# Patient Record
Sex: Female | Born: 1960 | Race: White | Hispanic: No | Marital: Married | Smoking: Former smoker
Health system: Southern US, Community
[De-identification: ages and names within clinical notes are randomized; demographics above are authoritative.]

## PROBLEM LIST (undated history)

## (undated) DIAGNOSIS — F419 Anxiety disorder, unspecified: Secondary | ICD-10-CM

## (undated) DIAGNOSIS — Z87442 Personal history of urinary calculi: Secondary | ICD-10-CM

## (undated) DIAGNOSIS — N83202 Unspecified ovarian cyst, left side: Secondary | ICD-10-CM

## (undated) DIAGNOSIS — F32A Depression, unspecified: Secondary | ICD-10-CM

## (undated) DIAGNOSIS — R51 Headache: Secondary | ICD-10-CM

## (undated) DIAGNOSIS — R519 Headache, unspecified: Secondary | ICD-10-CM

## (undated) DIAGNOSIS — I10 Essential (primary) hypertension: Secondary | ICD-10-CM

## (undated) DIAGNOSIS — F410 Panic disorder [episodic paroxysmal anxiety] without agoraphobia: Secondary | ICD-10-CM

## (undated) DIAGNOSIS — N83201 Unspecified ovarian cyst, right side: Secondary | ICD-10-CM

## (undated) DIAGNOSIS — F329 Major depressive disorder, single episode, unspecified: Secondary | ICD-10-CM

## (undated) HISTORY — DX: Major depressive disorder, single episode, unspecified: F32.9

## (undated) HISTORY — PX: TUBAL LIGATION: SHX77

## (undated) HISTORY — DX: Panic disorder (episodic paroxysmal anxiety): F41.0

## (undated) HISTORY — DX: Depression, unspecified: F32.A

## (undated) HISTORY — DX: Anxiety disorder, unspecified: F41.9

---

## 2004-07-09 ENCOUNTER — Encounter: Admission: RE | Admit: 2004-07-09 | Discharge: 2004-07-09 | Payer: Self-pay | Admitting: Family Medicine

## 2011-02-04 ENCOUNTER — Emergency Department (HOSPITAL_COMMUNITY)
Admission: EM | Admit: 2011-02-04 | Discharge: 2011-02-04 | Disposition: A | Discharge status: Home / Self Care | Payer: Self-pay | Attending: Emergency Medicine | Admitting: Emergency Medicine

## 2011-02-04 ENCOUNTER — Emergency Department (HOSPITAL_COMMUNITY): Payer: Self-pay

## 2011-02-04 DIAGNOSIS — K279 Peptic ulcer, site unspecified, unspecified as acute or chronic, without hemorrhage or perforation: Secondary | ICD-10-CM | POA: Insufficient documentation

## 2011-02-04 DIAGNOSIS — R112 Nausea with vomiting, unspecified: Secondary | ICD-10-CM | POA: Insufficient documentation

## 2011-02-04 DIAGNOSIS — Z87442 Personal history of urinary calculi: Secondary | ICD-10-CM | POA: Insufficient documentation

## 2011-02-04 DIAGNOSIS — R1013 Epigastric pain: Secondary | ICD-10-CM | POA: Insufficient documentation

## 2011-02-04 LAB — COMPREHENSIVE METABOLIC PANEL
ALT: 95 U/L — ABNORMAL HIGH (ref 0–35)
AST: 68 U/L — ABNORMAL HIGH (ref 0–37)
Albumin: 3.8 g/dL (ref 3.5–5.2)
Alkaline Phosphatase: 112 U/L (ref 39–117)
BUN: 7 mg/dL (ref 6–23)
CO2: 23 mEq/L (ref 19–32)
Calcium: 9.3 mg/dL (ref 8.4–10.5)
Chloride: 101 mEq/L (ref 96–112)
Creatinine, Ser: 0.67 mg/dL (ref 0.4–1.2)
GFR calc Af Amer: >60 mL/min (ref >60)
GFR calc non Af Amer: >60 mL/min (ref >60)
Glucose, Bld: 106 mg/dL — ABNORMAL HIGH (ref 70–99)
Potassium: 3.7 mEq/L (ref 3.5–5.1)
Sodium: 137 mEq/L (ref 135–145)
Total Bilirubin: 0.2 mg/dL — ABNORMAL LOW (ref 0.3–1.2)
Total Protein: 7.7 g/dL (ref 6.0–8.3)

## 2011-02-04 LAB — CBC
HCT: 42.4 % (ref 36.0–46.0)
Hemoglobin: 14.2 g/dL (ref 12.0–15.0)
MCH: 27.8 pg (ref 26.0–34.0)
MCHC: 33.5 g/dL (ref 30.0–36.0)
MCV: 83 fL (ref 78.0–100.0)
Platelets: 293 10*3/uL (ref 150–400)
RBC: 5.11 MIL/uL (ref 3.87–5.11)
RDW: 14.1 % (ref 11.5–15.5)
WBC: 9.2 10*3/uL (ref 4.0–10.5)

## 2011-02-04 LAB — URINE MICROSCOPIC-ADD ON

## 2011-02-04 LAB — URINALYSIS, ROUTINE W REFLEX MICROSCOPIC
Bilirubin Urine: NEGATIVE
Glucose, UA: NEGATIVE mg/dL
Ketones, ur: NEGATIVE mg/dL
Nitrite: NEGATIVE
Protein, ur: NEGATIVE mg/dL
Specific Gravity, Urine: 1.006 (ref 1.005–1.030)
Urobilinogen, UA: 0.2 mg/dL (ref 0.0–1.0)
pH: 6 (ref 5.0–8.0)

## 2011-02-04 LAB — DIFFERENTIAL
Basophils Absolute: 0 10*3/uL (ref 0.0–0.1)
Basophils Relative: 0 % (ref 0–1)
Eosinophils Absolute: 0.1 10*3/uL (ref 0.0–0.7)
Eosinophils Relative: 1 % (ref 0–5)
Lymphocytes Relative: 32 % (ref 12–46)
Lymphs Abs: 3 10*3/uL (ref 0.7–4.0)
Monocytes Absolute: 0.8 10*3/uL (ref 0.1–1.0)
Monocytes Relative: 8 % (ref 3–12)
Neutro Abs: 5.4 10*3/uL (ref 1.7–7.7)
Neutrophils Relative %: 58 % (ref 43–77)

## 2011-02-04 LAB — LIPASE, BLOOD: Lipase: 32 U/L (ref 11–59)

## 2011-02-05 LAB — URINE CULTURE
Colony Count: 15000
Culture  Setup Time: 201205242135

## 2018-07-11 ENCOUNTER — Emergency Department (HOSPITAL_COMMUNITY)
Admission: EM | Admit: 2018-07-11 | Discharge: 2018-07-11 | Disposition: A | Discharge status: Home / Self Care | Payer: Self-pay | Attending: Emergency Medicine | Admitting: Emergency Medicine

## 2018-07-11 ENCOUNTER — Other Ambulatory Visit: Payer: Self-pay

## 2018-07-11 ENCOUNTER — Emergency Department (HOSPITAL_COMMUNITY): Payer: Self-pay

## 2018-07-11 ENCOUNTER — Encounter (HOSPITAL_COMMUNITY): Payer: Self-pay | Admitting: Emergency Medicine

## 2018-07-11 DIAGNOSIS — N838 Other noninflammatory disorders of ovary, fallopian tube and broad ligament: Secondary | ICD-10-CM

## 2018-07-11 DIAGNOSIS — I10 Essential (primary) hypertension: Secondary | ICD-10-CM | POA: Insufficient documentation

## 2018-07-11 DIAGNOSIS — N839 Noninflammatory disorder of ovary, fallopian tube and broad ligament, unspecified: Secondary | ICD-10-CM | POA: Insufficient documentation

## 2018-07-11 DIAGNOSIS — N200 Calculus of kidney: Secondary | ICD-10-CM | POA: Insufficient documentation

## 2018-07-11 HISTORY — DX: Essential (primary) hypertension: I10

## 2018-07-11 LAB — COMPREHENSIVE METABOLIC PANEL
ALT: 33 U/L (ref 0–44)
AST: 24 U/L (ref 15–41)
Albumin: 3.8 g/dL (ref 3.5–5.0)
Alkaline Phosphatase: 81 U/L (ref 38–126)
Anion gap: 9 (ref 5–15)
BUN: 14 mg/dL (ref 6–20)
CO2: 21 mmol/L — ABNORMAL LOW (ref 22–32)
Calcium: 9.5 mg/dL (ref 8.9–10.3)
Chloride: 99 mmol/L (ref 98–111)
Creatinine, Ser: 1.06 mg/dL — ABNORMAL HIGH (ref 0.44–1.00)
GFR calc Af Amer: >60 mL/min (ref >60)
GFR calc non Af Amer: 58 mL/min — ABNORMAL LOW (ref >60)
Glucose, Bld: 119 mg/dL — ABNORMAL HIGH (ref 70–99)
Potassium: 3.7 mmol/L (ref 3.5–5.1)
Sodium: 129 mmol/L — ABNORMAL LOW (ref 135–145)
Total Bilirubin: 0.8 mg/dL (ref 0.3–1.2)
Total Protein: 7.4 g/dL (ref 6.5–8.1)

## 2018-07-11 LAB — URINALYSIS, ROUTINE W REFLEX MICROSCOPIC
Bacteria, UA: NONE SEEN
Bilirubin Urine: NEGATIVE
Glucose, UA: NEGATIVE mg/dL
Ketones, ur: NEGATIVE mg/dL
Nitrite: NEGATIVE
Protein, ur: NEGATIVE mg/dL
Specific Gravity, Urine: 1.021 (ref 1.005–1.030)
pH: 6 (ref 5.0–8.0)

## 2018-07-11 LAB — CBC
HCT: 50.4 % — ABNORMAL HIGH (ref 36.0–46.0)
Hemoglobin: 16.8 g/dL — ABNORMAL HIGH (ref 12.0–15.0)
MCH: 27.3 pg (ref 26.0–34.0)
MCHC: 33.3 g/dL (ref 30.0–36.0)
MCV: 82 fL (ref 80.0–100.0)
Platelets: 262 10*3/uL (ref 150–400)
RBC: 6.15 MIL/uL — ABNORMAL HIGH (ref 3.87–5.11)
RDW: 13.2 % (ref 11.5–15.5)
WBC: 14.3 10*3/uL — ABNORMAL HIGH (ref 4.0–10.5)
nRBC: 0 % (ref 0.0–0.2)

## 2018-07-11 LAB — LIPASE, BLOOD: Lipase: 30 U/L (ref 11–51)

## 2018-07-11 LAB — I-STAT BETA HCG BLOOD, ED (MC, WL, AP ONLY): I-stat hCG, quantitative: <5 m[IU]/mL (ref <5)

## 2018-07-11 MED ORDER — ONDANSETRON HCL 4 MG/2ML IJ SOLN
4.0000 mg | Freq: Once | INTRAMUSCULAR | Status: AC
  Administered 2018-07-11: 4 mg via INTRAVENOUS
  Filled 2018-07-11: qty 2

## 2018-07-11 MED ORDER — KETOROLAC TROMETHAMINE 10 MG PO TABS: 10.0000 mg | ORAL_TABLET | Freq: Four times a day (QID) | ORAL | 0 refills | Status: DC | PRN

## 2018-07-11 MED ORDER — KETOROLAC TROMETHAMINE 30 MG/ML IJ SOLN
30.0000 mg | Freq: Once | INTRAMUSCULAR | Status: AC
  Administered 2018-07-11: 30 mg via INTRAVENOUS
  Filled 2018-07-11: qty 1

## 2018-07-11 MED ORDER — KETOROLAC TROMETHAMINE 30 MG/ML IJ SOLN: 30.0000 mg | Freq: Once | INTRAMUSCULAR | Status: DC

## 2018-07-11 MED ORDER — TAMSULOSIN HCL 0.4 MG PO CAPS: 0.4000 mg | ORAL_CAPSULE | Freq: Every day | ORAL | 0 refills | Status: DC

## 2018-07-11 MED ORDER — OXYCODONE-ACETAMINOPHEN 5-325 MG PO TABS: 1.0000 | ORAL_TABLET | Freq: Four times a day (QID) | ORAL | 0 refills | Status: DC | PRN

## 2018-07-11 NOTE — ED Notes (Signed)
Pt provided urine sample but not enough to culture. UA sent to lab.

## 2018-07-11 NOTE — ED Notes (Signed)
Pt provided adequate urine sample.

## 2018-07-11 NOTE — ED Notes (Signed)
Results reviewed, no changes in acuity at this time 

## 2018-07-11 NOTE — ED Provider Notes (Signed)
[facility] EM[facility] DEPARTMENT Provider Note   CSN: [id number] Arrival date & time: 07/11/18  1141     History   Chief Complaint Chief Complaint  Patient presents with  . Flank Pain    HPI Melanie Gallegos is a 57 y.o. female.  HPI   57 year old female presents today with complaints of left-sided flank pain.  Patient notes symptoms started last night in her epigastric region, is radiated over to the left upper quadrant and flank.  Patient notes symptoms feel similar to previous kidney stone.  He notes some nausea and vomiting, denies any lower abdominal pain, notes decreased urine output secondary to decreased p.o. Intake.  She denies any dysuria.  She notes taking hydrocodone this morning at 8:00 and 10 AM with very minimal improvement in her symptoms.  Denies any fever.   Past Medical History:  Diagnosis Date  . Hypertension     There are no active problems to display for this patient.    OB History   None      Home Medications    Prior to Admission medications   Medication Sig Start Date End Date Taking? Authorizing Provider  oxyCODONE-acetaminophen (PERCOCET/ROXICET) 5-325 MG tablet Take 1 tablet by mouth every 6 (six) hours as needed for severe pain. 07/11/18   Shyne Lehrke, Dellis Filbert, PA-C  tamsulosin (FLOMAX) 0.4 MG CAPS capsule Take 1 capsule (0.4 mg total) by mouth daily. 07/11/18   Okey Regal, PA-C    Family History No family history on file.  Social History Social History   Tobacco Use  . Smoking status: Not on file  Substance Use Topics  . Alcohol use: Not on file  . Drug use: Not on file     Allergies   Patient has no known allergies.   Review of Systems Review of Systems  All other systems reviewed and are negative.   Physical Exam Updated Vital Signs BP (!) 149/83 (BP Location: Left Arm)   Pulse 100   Temp 97.9 F (36.6 C) (Oral)   Resp 16   SpO2 98%   Physical Exam  Constitutional: She is oriented to  person, place, and time. She appears well-developed and well-nourished.  HENT:  Head: Normocephalic and atraumatic.  Eyes: Pupils are equal, round, and reactive to light. Conjunctivae are normal. Right eye exhibits no discharge. Left eye exhibits no discharge. No scleral icterus.  Neck: Normal range of motion. No JVD present. No tracheal deviation present.  Pulmonary/Chest: Effort normal. No stridor.  Abdominal:  Minimal tenderness to palpation of left upper quadrant-no CVA tenderness-normal tenderness palpation of upper lumbar musculature left side  Neurological: She is alert and oriented to person, place, and time. Coordination normal.  Psychiatric: She has a normal mood and affect. Her behavior is normal. Judgment and thought content normal.  Nursing note and vitals reviewed.    ED Treatments / Results  Labs (all labs ordered are listed, but only abnormal results are displayed) Labs Reviewed  URINALYSIS, ROUTINE W REFLEX MICROSCOPIC - Abnormal; Notable for the following components:      Result Value   APPearance HAZY (*)    Hgb urine dipstick SMALL (*)    Leukocytes, UA TRACE (*)    All other components within normal limits  CBC - Abnormal; Notable for the following components:   WBC 14.3 (*)    RBC 6.15 (*)    Hemoglobin 16.8 (*)    HCT 50.4 (*)    All other components within normal limits  COMPREHENSIVE METABOLIC PANEL - Abnormal; Notable for the following components:   Sodium 129 (*)    CO2 21 (*)    Glucose, Bld 119 (*)    Creatinine, Ser 1.06 (*)    GFR calc non Af Amer 58 (*)    All other components within normal limits  URINE CULTURE  LIPASE, BLOOD  I-STAT BETA HCG BLOOD, ED ([facility], [facility], [facility] ONLY)    EKG None  Radiology Ct Renal Stone Study  Result Date: 07/11/2018 CLINICAL DATA:  Left flank pain starting at 0400 hours last evening. Epigastric pain as well. EXAM: CT ABDOMEN AND PELVIS WITHOUT CONTRAST TECHNIQUE: Multidetector CT imaging of the abdomen and pelvis  was performed following the standard protocol without IV contrast. COMPARISON:  CT 12/29/2006 FINDINGS: Lower chest: Normal heart size without pericardial effusion. Clear lung bases without effusion mass. Subsegmental atelectasis and/or scarring is seen at each base. Hepatobiliary: Punctate calculus or calcified polyp near the fundus of the gallbladder, series 3/28. No secondary signs of acute cholecystitis. The unenhanced liver is unremarkable. Pancreas: Normal Spleen: Normal Adrenals/Urinary Tract: Normal bilateral adrenal glands. Mild left-sided hydroureteronephrosis secondary to a 6 mm calculus in the distal left ureter, series 3/91. Right kidney and ureter unremarkable. The urinary bladder is decompressed. Stomach/Bowel: Stomach is within normal limits. Appendix appears normal. No evidence of bowel wall thickening, distention, or inflammatory changes. Vascular/Lymphatic: Aortic atherosclerosis. No enlarged abdominal or pelvic lymph nodes. Reproductive: Large thinly septated cystic mass occupying much of the pelvis and extending up into the upper abdomen measuring 27.4 x 15.9 x 28 cm. A papillary excrescence is noted off the posterior aspect of mass along its upper septated compartment measuring approximately 2.9 x 3.5 x 4.4 cm. Soft tissue density along the caudal aspect of this mass may represent ovarian tissue versus a nodular component. This measures 3.4 x 2.6 x 2 cm. A cystic mass of the left ovary is also identified without septation or focal soft tissue component measuring 7.4 x 4.6 x 3 9 cm. No evidence of peritoneal implants or ascites. Other: Fat containing supraumbilical hernia measuring 7.6 x 3.5 x 6 cm with left fat containing inguinal hernia are noted. Musculoskeletal: No aggressive osseous lesions. IMPRESSION: 1. Large thinly septated cystic mass measuring 27.4 x 15.9 x 28 cm with papillary excrescence noted posteriorly within the upper most right-sided septated compartment. 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 Additional left-sided ovarian cystic mass without complicating features are also noted which may represent bilateral disease. No definite evidence of peritoneal implantation nor malignant ascites. 2. 6 mm distal left ureteral calculus with mild left-sided hydronephrosis. 3. Uncomplicated cholelithiasis or calcified polyp near the fundus of the gallbladder. No secondary signs of acute cholecystitis. 4. Fat containing supraumbilical hernia measuring 7.6 x 3.5 x 6 cm with fat containing left inguinal hernia. These results were called by telephone at the time of interpretation on 07/11/2018 at 6:38 pm to [place] , who verbally acknowledged these results. Electronically Signed   By: Ashley Royalty M.D.   On: 07/11/2018 18:38    Procedures Procedures (including critical care time)  Medications Ordered in ED Medications  ketorolac (TORADOL) 30 MG/ML injection 30 mg (30 mg Intravenous Given 07/11/18 1526)  ondansetron (ZOFRAN) injection 4 mg (4 mg Intravenous Given 07/11/18 1524)     Initial Impression / Assessment and Plan / ED Course  I have reviewed the triage vital signs and the nursing notes.  Pertinent labs & imaging results that were available during  my care of the patient were reviewed by me and considered in my medical decision making (see chart for details).     Labs: Urine culture, urinalysis, i-STAT beta-hCG lipase, CBC, CMP  Imaging: CT renal  Consults: Dr. Denman George   Therapeutics: Toradol  Discharge Meds: Percocet  Assessment/Plan: 57 year old female presents today with several conditions.  Patient has ureterolithiasis.  She has a 6 mm stone, no complicating features, no signs of infection, urine culture will be sent.  Patient's pain is well controlled here, she will be discharged home on Percocet.  After further review she has a history of ulcers and elected not to prescribe Toradol at home.  Patient also had a incidentally a large mass.  I  discussed the case with on-call OB/GYN ecologist Dr. Denman George who will have patient followed up in our clinic as an outpatient.  She has no obstructive signs or symptoms, is urinating freely in no acute distress.  She is safe for outpatient follow-up.  She is encouraged to follow-up with urology and OB/GYN, return to emergency room immediately if she develops any new or worsening signs or symptoms.  She verbalized understanding and agreement to today's plan had no further questions or concerns.    Final Clinical Impressions(s) / ED Diagnoses   Final diagnoses:  Nephrolithiasis  Ovarian mass    ED Discharge Orders         Ordered    tamsulosin (FLOMAX) 0.4 MG CAPS capsule  Daily,   Status:  Discontinued     07/11/18 1930    ketorolac (TORADOL) 10 MG tablet  Every 6 hours PRN,   Status:  Discontinued     07/11/18 1930    oxyCODONE-acetaminophen (PERCOCET/ROXICET) 5-325 MG tablet  Every 6 hours PRN,   Status:  Discontinued     07/11/18 1930    oxyCODONE-acetaminophen (PERCOCET/ROXICET) 5-325 MG tablet  Every 6 hours PRN     07/11/18 1934    tamsulosin (FLOMAX) 0.4 MG CAPS capsule  Daily     07/11/18 1934           Okey Regal, PA-C 07/11/18 2209    Tegeler, Gwenyth Allegra, MD 07/11/18 [phone]

## 2018-07-11 NOTE — ED Triage Notes (Addendum)
Left flank pain- hx of stones. Slow urination. Started around 0400 last night. Reports last night she felt epigastric pain but now in flank area. Has been vomiting a few times. Reports she has not been drinking a whole lot

## 2018-07-11 NOTE — Discharge Instructions (Addendum)
Please read attached information. If you experience any new or worsening signs or symptoms please return to the emergency room for evaluation. Please follow-up with your primary care provider or specialist as discussed. Please use medication prescribed only as directed and discontinue taking if you have any concerning signs or symptoms.   °

## 2018-07-11 NOTE — ED Notes (Signed)
ED Provider at bedside. 

## 2018-07-12 LAB — URINE CULTURE

## 2018-07-18 ENCOUNTER — Encounter: Payer: Self-pay | Admitting: Gynecologic Oncology

## 2018-07-18 ENCOUNTER — Inpatient Hospital Stay: Payer: Self-pay | Attending: Gynecologic Oncology | Admitting: Gynecologic Oncology

## 2018-07-18 VITALS — BP 153/97 | HR 70 | Temp 98.4°F | Resp 20 | Ht 64.0 in | Wt 233.9 lb

## 2018-07-18 DIAGNOSIS — N83202 Unspecified ovarian cyst, left side: Secondary | ICD-10-CM | POA: Insufficient documentation

## 2018-07-18 DIAGNOSIS — R1900 Intra-abdominal and pelvic swelling, mass and lump, unspecified site: Secondary | ICD-10-CM | POA: Insufficient documentation

## 2018-07-18 DIAGNOSIS — Z90722 Acquired absence of ovaries, bilateral: Secondary | ICD-10-CM | POA: Insufficient documentation

## 2018-07-18 NOTE — Progress Notes (Signed)
Consult Note: Gyn-Onc  Consult was requested by the ED for the evaluation of Melanie Gallegos 57 y.o. female  CC:  Chief Complaint  Patient presents with  . Pelvic mass    Assessment/Plan:  Melanie Gallegos  is a 57 y.o.  year old with a 23cm minimally complex cystic (likely) right ovarian mass, and smaller 7cm right ovarian cyst.  There are no other findings concerning for metastatic ovarian cancer on imaging (such as ascites, peritoneal nodularity, lymphadenopathy). I also discussed that the large size and long natural history of the mass also suggests malignancy is less likely. We will check CA 125 and CEA as part of her preop labs.   I reviewed options for surgery.  I stated that I recommended surgery to rule out malignant ovarian cancer, but also to remove a symptomatic large ovarian mass.  I am recommending exploratory laparotomy with cyst decompression and bilateral salpingo-oophorectomy.  We will send the specimen for frozen section and perform staging including hysterectomy if malignancy is found.  I discussed that if no malignancy is found in the ovarian mass is a free from an otherwise normal-appearing uterus we will not perform hysterectomy.  I discussed postoperative risks including  bleeding, infection, damage to internal organs (such as bladder,ureters, bowels), blood clot, reoperation and rehospitalization.  I discussed multimodal pain therapy postoperatively with plan to minimize opioid use.  I discussed the likelihood of development of a progression of menopausal symptoms, however we will not routinely prescribe hormone replacement therapy given her postmenopausal age, and will reserve this for quality of life limiting symptoms.  The patient is accepting of a surgical plan will we will proceed with surgery next week.  HPI: Melanie Gallegos is seen today in referral from the emergency department for consultation regarding a bilateral ovarian cystic mass is seen on CT scan  for renal stones.  The patient is 57 years of age and parous.  She was experiencing left flank pain on July 11, 2018 was seen by the emergency room at [facility].  CT scan of the abdomen and pelvis without contrast was performed which confirmed left-sided renal calculi, and an incidentally found 27.4 x 16 x 28 cm septated cystic mass occupying the entire pelvis and extending into the upper abdomen.  A papillary excrescence was noted off the posterior aspect of the mass which measured up to 4 cm but otherwise no other significant soft tissue densities was seen.  A separate cystic mass within the left ovary was also identified without septations and measured 7.4 cm.  A small fat-containing supraumbilical hernia was identified measuring 7.6 cm.  Tumor markers were not drawn  The patient is otherwise fairly healthy.  She has osteoarthritis of the spine and knees and depression anxiety and hypertension.  She has never had an abnormal Pap smear.  She has had 2 prior vaginal deliveries.  Her any prior abdominal surgery is a tubal ligation.  She reports gradual onset of abdominal distention over the past 2 to 4 years.  She occasionally feels a burning sensation at the umbilicus but no other symptoms from this mass.  She reports some urinary frequency, but denies early satiety or difficulty eating.  Current Meds:  Outpatient Encounter Medications as of 07/18/2018  Medication Sig  . albuterol (PROVENTIL HFA;VENTOLIN HFA) 108 (90 Base) MCG/ACT inhaler Inhale 2 puffs into the lungs as needed for wheezing or shortness of breath.  Marland Kitchen buPROPion (WELLBUTRIN SR) 150 MG 12 hr tablet Take 150 mg by  mouth daily.  . hydrochlorothiazide (HYDRODIURIL) 25 MG tablet Take 25 mg by mouth daily.  Marland Kitchen lisinopril (PRINIVIL,ZESTRIL) 20 MG tablet Take 20 mg by mouth daily.  . metoprolol tartrate (LOPRESSOR) 50 MG tablet Take 50 mg by mouth daily. for high blood pressure  . oxyCODONE-acetaminophen (PERCOCET/ROXICET) 5-325 MG tablet  Take 1 tablet by mouth every 6 (six) hours as needed for severe pain.  . [DISCONTINUED] tamsulosin (FLOMAX) 0.4 MG CAPS capsule Take 1 capsule (0.4 mg total) by mouth daily.   No facility-administered encounter medications on file as of 07/18/2018.     Allergy: No Known Allergies  Social Hx:   Social History   Socioeconomic History  . Marital status: Married    Spouse name: Not on file  . Number of children: Not on file  . Years of education: Not on file  . Highest education level: Not on file  Occupational History  . Not on file  Social Needs  . Financial resource strain: Not on file  . Food insecurity:    Worry: Not on file    Inability: Not on file  . Transportation needs:    Medical: Not on file    Non-medical: Not on file  Tobacco Use  . Smoking status: Current Every Day Smoker  . Smokeless tobacco: Never Used  . Tobacco comment: 1 to 4 cigarrettes a day  Substance and Sexual Activity  . Alcohol use: Never    Frequency: Never  . Drug use: Never  . Sexual activity: Not on file  Lifestyle  . Physical activity:    Days per week: Not on file    Minutes per session: Not on file  . Stress: Not on file  Relationships  . Social connections:    Talks on phone: Not on file    Gets together: Not on file    Attends religious service: Not on file    Active member of club or organization: Not on file    Attends meetings of clubs or organizations: Not on file    Relationship status: Not on file  . Intimate partner violence:    Fear of current or ex partner: Not on file    Emotionally abused: Not on file    Physically abused: Not on file    Forced sexual activity: Not on file  Other Topics Concern  . Not on file  Social History Narrative  . Not on file    Past Surgical Hx:  Past Surgical History:  Procedure Laterality Date  . TUBAL LIGATION     26years ago    Past Medical Hx:  Past Medical History:  Diagnosis Date  . Anxiety   . Depression   . Hypertension    . Panic attacks     Past Gynecological History:  G2P2 No LMP recorded. Patient is postmenopausal. No history of abnormal paps.  Family Hx:  Family History  Problem Relation Age of Onset  . Bladder Cancer Mother   . Heart disease Mother   . Thyroid disease Mother   . Atrial fibrillation Mother   . Arthritis Mother   . Parkinson's disease Father   . Thyroid disease Sister   . Endometriosis Sister     Review of Systems:  Constitutional  Feels well,    ENT Normal appearing ears and nares bilaterally Skin/Breast  No rash, sores, jaundice, itching, dryness Cardiovascular  No chest pain, shortness of breath, or edema  Pulmonary  No cough or wheeze.  Gastro Intestinal  No nausea,  vomitting, or diarrhoea. No bright red blood per rectum, no abdominal pain, change in bowel movement, or constipation. + abdominal distension Genito Urinary  No frequency, urgency, dysuria,  Musculo Skeletal  No myalgia, arthralgia, joint swelling or pain  Neurologic  No weakness, numbness, change in gait,  Psychology  No depression, anxiety, insomnia.   Vitals:  Blood pressure (!) 153/97, pulse 70, temperature 98.4 F (36.9 C), temperature source Oral, resp. rate 20, height 5\' 4"  (1.626 m), weight 233 lb 14.4 oz (106.1 kg), SpO2 98 %.  Physical Exam: WD in NAD Neck  Supple NROM, without any enlargements.  Lymph Node Survey No cervical supraclavicular or inguinal adenopathy Cardiovascular  Pulse normal rate, regularity and rhythm. S1 and S2 normal.  Lungs  Clear to auscultation bilateraly, without wheezes/crackles/rhonchi. Good air movement.  Skin  No rash/lesions/breakdown  Psychiatry  Alert and oriented to person, place, and time  Abdomen  Normoactive bowel sounds, abdomen soft, non-tender and mildly obese without palpable evidence of hernia. There is a cystic mobile fullness appreciated throughout the entire abdomen. Back No CVA tenderness Genito Urinary  Vulva/vagina: Normal  external female genitalia.   No lesions. No discharge or bleeding.  Bladder/urethra:  No lesions or masses, well supported bladder  Vagina: normal  Cervix: Normal appearing, no lesions.  Uterus:  Small, mobile, no parametrial involvement or nodularity.  Adnexa: large abdominopelvic masses filling pelvis and abdomen, smooth and mobile. Rectal  Good tone, no masses no cul de sac nodularity.  Extremities  No bilateral cyanosis, clubbing or edema.   Thereasa Solo, MD  07/18/2018, 4:05 PM

## 2018-07-18 NOTE — Patient Instructions (Addendum)
Preparing for your Surgery  Plan for surgery on November 12,2019 at [facility] with Dr. Everitt Amber. You will be scheduled for an Exploratory Laparotomy, Bilateral Salpingo-ophorectomy, and Possible Staging.   Pre-operative Testing -You will receive a phone call from presurgical testing at [facility] to arrange for a pre-operative testing appointment before your surgery.  This appointment normally occurs one to two weeks before your scheduled surgery.   -Bring your insurance card, copy of an advanced directive if applicable, medication list  -At that visit, you will be asked to sign a consent for a possible blood transfusion in case a transfusion becomes necessary during surgery.  The need for a blood transfusion is rare but having consent is a necessary part of your care.     -You should not be taking blood thinners or aspirin at least ten days prior to surgery unless instructed by your surgeon.  Day Before Surgery at [place] will be asked to take in a light diet the day before surgery.  Avoid carbonated beverages.  You will be advised to have nothing to eat or drink after midnight the evening before.    Eat a light diet the day before surgery.  Examples including soups, broths, toast, yogurt, mashed potatoes.  Things to avoid include carbonated beverages (fizzy beverages), raw fruits and raw vegetables, or beans.   If your bowels are filled with gas, your surgeon will have difficulty visualizing your pelvic organs which increases your surgical risks.  Your role in recovery Your role is to become active as soon as directed by your doctor, while still giving yourself time to heal.  Rest when you feel tired. You will be asked to do the following in order to speed your recovery:  - Cough and breathe deeply. This helps toclear and expand your lungs and can prevent pneumonia. You may be given a spirometer to practice deep breathing. A staff member will show you  how to use the spirometer. - Do mild physical activity. Walking or moving your legs help your circulation and body functions return to normal. A staff member will help you when you try to walk and will provide you with simple exercises. Do not try to get up or walk alone the first time. - Actively manage your pain. Managing your pain lets you move in comfort. We will ask you to rate your pain on a scale of zero to 10. It is your responsibility to tell your doctor or nurse where and how much you hurt so your pain can be treated.  Special Considerations -If you are diabetic, you may be placed on insulin after surgery to have closer control over your blood sugars to promote healing and recovery.  This does not mean that you will be discharged on insulin.  If applicable, your oral antidiabetics will be resumed when you are tolerating a solid diet.  -Your final pathology results from surgery should be available around one week after surgery and the results will be relayed to you when available.  -Dr. Lahoma Crocker is the Surgeon that assists your GYN Oncologist with surgery.  The next day after your surgery you will either see your GYN Oncologist, Dr Denman George, or Dr. Lahoma Crocker.  -FMLA forms can be faxed to [phone] and please allow 5-7 business days for completion. Eat a light diet the day before surgery.  Examples including soups, broths, toast, yogurt, mashed potatoes.  Things to avoid include carbonated beverages (fizzy beverages), raw fruits and raw  vegetables, or beans.   If your bowels are filled with gas, your surgeon will have difficulty visualizing your pelvic organs which increases your surgical risks.  Blood Transfusion Information WHAT IS A BLOOD TRANSFUSION? A transfusion is the replacement of blood or some of its parts. Blood is made up of multiple cells which provide different functions.  Red blood cells carry oxygen and are used for blood loss replacement.  White blood  cells fight against infection.  Platelets control bleeding.  Plasma helps clot blood.  Other blood products are available for specialized needs, such as hemophilia or other clotting disorders. BEFORE THE TRANSFUSION  Who gives blood for transfusions?   You may be able to donate blood to be used at a later date on yourself (autologous donation).  Relatives can be asked to donate blood. This is generally not any safer than if you have received blood from a stranger. The same precautions are taken to ensure safety when a relative's blood is donated.  Healthy volunteers who are fully evaluated to make sure their blood is safe. This is blood bank blood. Transfusion therapy is the safest it has ever been in the practice of medicine. Before blood is taken from a donor, a complete history is taken to make sure that person has no history of diseases nor engages in risky social behavior (examples are intravenous drug use or sexual activity with multiple partners). The donor's travel history is screened to minimize risk of transmitting infections, such as malaria. The donated blood is tested for signs of infectious diseases, such as HIV and hepatitis. The blood is then tested to be sure it is compatible with you in order to minimize the chance of a transfusion reaction. If you or a relative donates blood, this is often done in anticipation of surgery and is not appropriate for emergency situations. It takes many days to process the donated blood. RISKS AND COMPLICATIONS Although transfusion therapy is very safe and saves many lives, the main dangers of transfusion include:   Getting an infectious disease.  Developing a transfusion reaction. This is an allergic reaction to something in the blood you were given. Every precaution is taken to prevent this. The decision to have a blood transfusion has been considered carefully by your caregiver before blood is given. Blood is not given unless the benefits  outweigh the risks.

## 2018-07-18 NOTE — H&P (View-Only) (Signed)
Consult Note: Gyn-Onc  Consult was requested by the ED for the evaluation of Melanie Gallegos 57 y.o. female  CC:  Chief Complaint  Patient presents with  . Pelvic mass    Assessment/Plan:  [name]  is a 57 y.o.  year old with a 23cm minimally complex cystic (likely) right ovarian mass, and smaller 7cm right ovarian cyst.  There are no other findings concerning for metastatic ovarian cancer on imaging (such as ascites, peritoneal nodularity, lymphadenopathy). I also discussed that the large size and long natural history of the mass also suggests malignancy is less likely. We will check CA 125 and CEA as part of her preop labs.   I reviewed options for surgery.  I stated that I recommended surgery to rule out malignant ovarian cancer, but also to remove a symptomatic large ovarian mass.  I am recommending exploratory laparotomy with cyst decompression and bilateral salpingo-oophorectomy.  We will send the specimen for frozen section and perform staging including hysterectomy if malignancy is found.  I discussed that if no malignancy is found in the ovarian mass is a free from an otherwise normal-appearing uterus we will not perform hysterectomy.  I discussed postoperative risks including  bleeding, infection, damage to internal organs (such as bladder,ureters, bowels), blood clot, reoperation and rehospitalization.  I discussed multimodal pain therapy postoperatively with plan to minimize opioid use.  I discussed the likelihood of development of a progression of menopausal symptoms, however we will not routinely prescribe hormone replacement therapy given her postmenopausal age, and will reserve this for quality of life limiting symptoms.  The patient is accepting of a surgical plan will we will proceed with surgery next week.  HPI: Ms.Melanie Gallegos is seen today in referral from the emergency department for consultation regarding a bilateral ovarian cystic mass is seen on CT scan  for renal stones.  The patient is 57 years of age and parous.  She was experiencing left flank pain on July 11, 2018 was seen by the emergency room at [facility].  CT scan of the abdomen and pelvis without contrast was performed which confirmed left-sided renal calculi, and an incidentally found 27.4 x 16 x 28 cm septated cystic mass occupying the entire pelvis and extending into the upper abdomen.  A papillary excrescence was noted off the posterior aspect of the mass which measured up to 4 cm but otherwise no other significant soft tissue densities was seen.  A separate cystic mass within the left ovary was also identified without septations and measured 7.4 cm.  A small fat-containing supraumbilical hernia was identified measuring 7.6 cm.  Tumor markers were not drawn  The patient is otherwise fairly healthy.  She has osteoarthritis of the spine and knees and depression anxiety and hypertension.  She has never had an abnormal Pap smear.  She has had 2 prior vaginal deliveries.  Her any prior abdominal surgery is a tubal ligation.  She reports gradual onset of abdominal distention over the past 2 to 4 years.  She occasionally feels a burning sensation at the umbilicus but no other symptoms from this mass.  She reports some urinary frequency, but denies early satiety or difficulty eating.  Current Meds:  Outpatient Encounter Medications as of 07/18/2018  Medication Sig  . albuterol (PROVENTIL HFA;VENTOLIN HFA) 108 (90 Base) MCG/ACT inhaler Inhale 2 puffs into the lungs as needed for wheezing or shortness of breath.  Marland Kitchen buPROPion (WELLBUTRIN SR) 150 MG 12 hr tablet Take 150 mg by  mouth daily.  . hydrochlorothiazide (HYDRODIURIL) 25 MG tablet Take 25 mg by mouth daily.  Marland Kitchen lisinopril (PRINIVIL,ZESTRIL) 20 MG tablet Take 20 mg by mouth daily.  . metoprolol tartrate (LOPRESSOR) 50 MG tablet Take 50 mg by mouth daily. for high blood pressure  . oxyCODONE-acetaminophen (PERCOCET/ROXICET) 5-325 MG tablet  Take 1 tablet by mouth every 6 (six) hours as needed for severe pain.  . [DISCONTINUED] tamsulosin (FLOMAX) 0.4 MG CAPS capsule Take 1 capsule (0.4 mg total) by mouth daily.   No facility-administered encounter medications on file as of 07/18/2018.     Allergy: No Known Allergies  Social Hx:   Social History   Socioeconomic History  . Marital status: Married    Spouse name: Not on file  . Number of children: Not on file  . Years of education: Not on file  . Highest education level: Not on file  Occupational History  . Not on file  Social Needs  . Financial resource strain: Not on file  . Food insecurity:    Worry: Not on file    Inability: Not on file  . Transportation needs:    Medical: Not on file    Non-medical: Not on file  Tobacco Use  . Smoking status: Current Every Day Smoker  . Smokeless tobacco: Never Used  . Tobacco comment: 1 to 4 cigarrettes a day  Substance and Sexual Activity  . Alcohol use: Never    Frequency: Never  . Drug use: Never  . Sexual activity: Not on file  Lifestyle  . Physical activity:    Days per week: Not on file    Minutes per session: Not on file  . Stress: Not on file  Relationships  . Social connections:    Talks on phone: Not on file    Gets together: Not on file    Attends religious service: Not on file    Active member of club or organization: Not on file    Attends meetings of clubs or organizations: Not on file    Relationship status: Not on file  . Intimate partner violence:    Fear of current or ex partner: Not on file    Emotionally abused: Not on file    Physically abused: Not on file    Forced sexual activity: Not on file  Other Topics Concern  . Not on file  Social History Narrative  . Not on file    Past Surgical Hx:  Past Surgical History:  Procedure Laterality Date  . TUBAL LIGATION     26years ago    Past Medical Hx:  Past Medical History:  Diagnosis Date  . Anxiety   . Depression   . Hypertension    . Panic attacks     Past Gynecological History:  G2P2 No LMP recorded. Patient is postmenopausal. No history of abnormal paps.  Family Hx:  Family History  Problem Relation Age of Onset  . Bladder Cancer Mother   . Heart disease Mother   . Thyroid disease Mother   . Atrial fibrillation Mother   . Arthritis Mother   . Parkinson's disease Father   . Thyroid disease Sister   . Endometriosis Sister     Review of Systems:  Constitutional  Feels well,    ENT Normal appearing ears and nares bilaterally Skin/Breast  No rash, sores, jaundice, itching, dryness Cardiovascular  No chest pain, shortness of breath, or edema  Pulmonary  No cough or wheeze.  Gastro Intestinal  No nausea,  vomitting, or diarrhoea. No bright red blood per rectum, no abdominal pain, change in bowel movement, or constipation. + abdominal distension Genito Urinary  No frequency, urgency, dysuria,  Musculo Skeletal  No myalgia, arthralgia, joint swelling or pain  Neurologic  No weakness, numbness, change in gait,  Psychology  No depression, anxiety, insomnia.   Vitals:  Blood pressure (!) 153/97, pulse 70, temperature 98.4 F (36.9 C), temperature source Oral, resp. rate 20, height 5\' 4"  (1.626 m), weight 233 lb 14.4 oz (106.1 kg), SpO2 98 %.  Physical Exam: WD in NAD Neck  Supple NROM, without any enlargements.  Lymph Node Survey No cervical supraclavicular or inguinal adenopathy Cardiovascular  Pulse normal rate, regularity and rhythm. S1 and S2 normal.  Lungs  Clear to auscultation bilateraly, without wheezes/crackles/rhonchi. Good air movement.  Skin  No rash/lesions/breakdown  Psychiatry  Alert and oriented to person, place, and time  Abdomen  Normoactive bowel sounds, abdomen soft, non-tender and mildly obese without palpable evidence of hernia. There is a cystic mobile fullness appreciated throughout the entire abdomen. Back No CVA tenderness Genito Urinary  Vulva/vagina: Normal  external female genitalia.   No lesions. No discharge or bleeding.  Bladder/urethra:  No lesions or masses, well supported bladder  Vagina: normal  Cervix: Normal appearing, no lesions.  Uterus:  Small, mobile, no parametrial involvement or nodularity.  Adnexa: large abdominopelvic masses filling pelvis and abdomen, smooth and mobile. Rectal  Good tone, no masses no cul de sac nodularity.  Extremities  No bilateral cyanosis, clubbing or edema.   Thereasa Solo, MD  07/18/2018, 4:05 PM

## 2018-07-19 DIAGNOSIS — N838 Other noninflammatory disorders of ovary, fallopian tube and broad ligament: Secondary | ICD-10-CM

## 2018-07-20 NOTE — Patient Instructions (Addendum)
Melanie Gallegos  07/20/2018   Your procedure is scheduled on: 07-25-18   Report to [facility] Main  Entrance    Report to admitting at 12:00PM    Call this number if you have problems the morning of surgery [phone]   NO SMOKING FOR 24 HOURS BEFORE SURGERY!    Remember: Eat a light diet the day before surgery. Examples including soups, broths, toast, yogurt, mashed potatoes. Things to avoid include carbonated beverages (fizzy beverages), raw fruits and raw vegetables, or beans.   Only clear liquids after midnight. Nothing by mouth 3 hours prior to scheduled surgery. Please finish carbohydrate drink 3 hours prior to surgery at 11:30AM!!   BRUSH YOUR TEETH MORNING OF SURGERY AND RINSE YOUR MOUTH OUT, NO CHEWING GUM CANDY OR MINTS.     CLEAR LIQUID DIET   Foods Allowed                                                                     Foods Excluded  Coffee and tea, regular and decaf                             liquids that you cannot  Plain Jell-O in any flavor                                             see through such as: Fruit ices (not with fruit pulp)                                     milk, soups, orange juice  Iced Popsicles                                    All solid food Carbonated beverages, regular and diet                                    Cranberry, grape and apple juices Sports drinks like Gatorade Lightly seasoned clear broth or consume(fat free) Sugar, honey syrup  Sample Menu Breakfast                                Lunch                                     Supper Cranberry juice                    Beef broth                            Chicken broth Jell-O  Grape juice                           Apple juice Coffee or tea                        Jell-O                                      Popsicle                                                Coffee or tea                        Coffee or  tea  _____________________________________________________________________       Take these medicines the morning of surgery with A SIP OF WATER: metoprolol, albuterol inhaler if needed                                You may not have any metal on your body including hair pins and              piercings  Do not wear jewelry, make-up, lotions, powders or perfumes, deodorant             Do not wear nail polish.  Do not shave  48 hours prior to surgery.                 Do not bring valuables to the hospital. [place].  Contacts, dentures or bridgework may not be worn into surgery.  Leave suitcase in the car. After surgery it may be brought to your room.                 Please read over the following fact sheets you were given: _____________________________________________________________________             [facility] - Preparing for Surgery Before surgery, you can play an important role.  Because skin is not sterile, your skin needs to be as free of germs as possible.  You can reduce the number of germs on your skin by washing with CHG (chlorahexidine gluconate) soap before surgery.  CHG is an antiseptic cleaner which kills germs and bonds with the skin to continue killing germs even after washing. Please DO NOT use if you have an allergy to CHG or antibacterial soaps.  If your skin becomes reddened/irritated stop using the CHG and inform your nurse when you arrive at Short Stay. Do not shave (including legs and underarms) for at least 48 hours prior to the first CHG shower.  You may shave your face/neck. Please follow these instructions carefully:  1.  Shower with CHG Soap the night before surgery and the  morning of Surgery.  2.  If you choose to wash your hair, wash your hair first as usual with your  normal  shampoo.  3.  After you shampoo, rinse your hair and body thoroughly to remove the  shampoo.  4.   Use CHG as you would any other liquid soap.  You can apply chg directly  to the skin and wash                       Gently with a scrungie or clean washcloth.  5.  Apply the CHG Soap to your body ONLY FROM THE NECK DOWN.   Do not use on face/ open                           Wound or open sores. Avoid contact with eyes, ears mouth and genitals (private parts).                       Wash face,  Genitals (private parts) with your normal soap.             6.  Wash thoroughly, paying special attention to the area where your surgery  will be performed.  7.  Thoroughly rinse your body with warm water from the neck down.  8.  DO NOT shower/wash with your normal soap after using and rinsing off  the CHG Soap.                9.  Pat yourself dry with a clean towel.            10.  Wear clean pajamas.            11.  Place clean sheets on your bed the night of your first shower and do not  sleep with pets. Day of Surgery : Do not apply any lotions/deodorants the morning of surgery.  Please wear clean clothes to the hospital/surgery center.  FAILURE TO FOLLOW THESE INSTRUCTIONS MAY RESULT IN THE CANCELLATION OF YOUR SURGERY PATIENT SIGNATURE_________________________________  NURSE SIGNATURE__________________________________  ________________________________________________________________________   [name]  An incentive spirometer is a tool that can help keep your lungs clear and active. This tool measures how well you are filling your lungs with each breath. Taking long deep breaths may help reverse or decrease the chance of developing breathing (pulmonary) problems (especially infection) following:  A long period of time when you are unable to move or be active. BEFORE THE PROCEDURE   If the spirometer includes an indicator to show your best effort, your nurse or respiratory therapist will set it to a desired goal.  If possible, sit up straight or lean slightly forward. Try not to  slouch.  Hold the incentive spirometer in an upright position. INSTRUCTIONS FOR USE  1. Sit on the edge of your bed if possible, or sit up as far as you can in bed or on a chair. 2. Hold the incentive spirometer in an upright position. 3. Breathe out normally. 4. Place the mouthpiece in your mouth and seal your lips tightly around it. 5. Breathe in slowly and as deeply as possible, raising the piston or the ball toward the top of the column. 6. Hold your breath for 3-5 seconds or for as long as possible. Allow the piston or ball to fall to the bottom of the column. 7. Remove the mouthpiece from your mouth and breathe out normally. 8. Rest for a few seconds and repeat Steps 1 through 7 at least 10 times every 1-2 hours when you are awake. Take your time and take a few normal breaths between deep breaths. 9. The spirometer may include an indicator to  show your best effort. Use the indicator as a goal to work toward during each repetition. 10. After each set of 10 deep breaths, practice coughing to be sure your lungs are clear. If you have an incision (the cut made at the time of surgery), support your incision when coughing by placing a pillow or rolled up towels firmly against it. Once you are able to get out of bed, walk around indoors and cough well. You may stop using the incentive spirometer when instructed by your caregiver.  RISKS AND COMPLICATIONS  Take your time so you do not get dizzy or light-headed.  If you are in pain, you may need to take or ask for pain medication before doing incentive spirometry. It is harder to take a deep breath if you are having pain. AFTER USE  Rest and breathe slowly and easily.  It can be helpful to keep track of a log of your progress. Your caregiver can provide you with a simple table to help with this. If you are using the spirometer at home, follow these instructions: [place] IF:   You are having difficultly using the spirometer.  You  have trouble using the spirometer as often as instructed.  Your pain medication is not giving enough relief while using the spirometer.  You develop fever of 100.5 F (38.1 C) or higher. SEEK IMMEDIATE MEDICAL CARE IF:   You cough up bloody sputum that had not been present before.  You develop fever of 102 F (38.9 C) or greater.  You develop worsening pain at or near the incision site. MAKE SURE YOU:   Understand these instructions.  Will watch your condition.  Will get help right away if you are not doing well or get worse. Document Released: 01/10/2007 Document Revised: 11/22/2011 Document Reviewed: 03/13/2007 Exit[employer] Patient Information 2014 [employer], [place].   ________________________________________________________________________  WHAT IS A BLOOD TRANSFUSION? Blood Transfusion Information  A transfusion is the replacement of blood or some of its parts. Blood is made up of multiple cells which provide different functions.  Red blood cells carry oxygen and are used for blood loss replacement.  White blood cells fight against infection.  Platelets control bleeding.  Plasma helps clot blood.  Other blood products are available for specialized needs, such as hemophilia or other clotting disorders. BEFORE THE TRANSFUSION  Who gives blood for transfusions?   Healthy volunteers who are fully evaluated to make sure their blood is safe. This is blood bank blood. Transfusion therapy is the safest it has ever been in the practice of medicine. Before blood is taken from a donor, a complete history is taken to make sure that person has no history of diseases nor engages in risky social behavior (examples are intravenous drug use or sexual activity with multiple partners). The donor's travel history is screened to minimize risk of transmitting infections, such as malaria. The donated blood is tested for signs of infectious diseases, such as HIV and hepatitis. The blood is then  tested to be sure it is compatible with you in order to minimize the chance of a transfusion reaction. If you or a relative donates blood, this is often done in anticipation of surgery and is not appropriate for emergency situations. It takes many days to process the donated blood. RISKS AND COMPLICATIONS Although transfusion therapy is very safe and saves many lives, the main dangers of transfusion include:   Getting an infectious disease.  Developing a transfusion reaction. This is an allergic reaction  to something in the blood you were given. Every precaution is taken to prevent this. The decision to have a blood transfusion has been considered carefully by your caregiver before blood is given. Blood is not given unless the benefits outweigh the risks. AFTER THE TRANSFUSION  Right after receiving a blood transfusion, you will usually feel much better and more energetic. This is especially true if your red blood cells have gotten low (anemic). The transfusion raises the level of the red blood cells which carry oxygen, and this usually causes an energy increase.  The nurse administering the transfusion will monitor you carefully for complications. HOME CARE INSTRUCTIONS  No special instructions are needed after a transfusion. You may find your energy is better. Speak with your caregiver about any limitations on activity for underlying diseases you may have. SEEK MEDICAL CARE IF:   Your condition is not improving after your transfusion.  You develop redness or irritation at the intravenous (IV) site. SEEK IMMEDIATE MEDICAL CARE IF:  Any of the following symptoms occur over the next 12 hours:  Shaking chills.  You have a temperature by mouth above 102 F (38.9 C), not controlled by medicine.  Chest, back, or muscle pain.  People around you feel you are not acting correctly or are confused.  Shortness of breath or difficulty breathing.  Dizziness and fainting.  You get a rash or  develop hives.  You have a decrease in urine output.  Your urine turns a dark color or changes to pink, red, or brown. Any of the following symptoms occur over the next 10 days:  You have a temperature by mouth above 102 F (38.9 C), not controlled by medicine.  Shortness of breath.  Weakness after normal activity.  The white part of the eye turns yellow (jaundice).  You have a decrease in the amount of urine or are urinating less often.  Your urine turns a dark color or changes to pink, red, or brown. Document Released: 08/27/2000 Document Revised: 11/22/2011 Document Reviewed: 04/15/2008 [facility] Patient Information 2014 [place], [place].  _______________________________________________________________________

## 2018-07-21 ENCOUNTER — Encounter (HOSPITAL_COMMUNITY)
Admission: RE | Admit: 2018-07-21 | Discharge: 2018-07-21 | Disposition: A | Discharge status: Home / Self Care | Payer: Medicaid Other | Source: Ambulatory Visit | Attending: Gynecologic Oncology | Admitting: Gynecologic Oncology

## 2018-07-21 ENCOUNTER — Encounter (HOSPITAL_COMMUNITY): Payer: Self-pay

## 2018-07-21 ENCOUNTER — Other Ambulatory Visit: Payer: Self-pay

## 2018-07-21 DIAGNOSIS — Z01812 Encounter for preprocedural laboratory examination: Secondary | ICD-10-CM | POA: Insufficient documentation

## 2018-07-21 DIAGNOSIS — I10 Essential (primary) hypertension: Secondary | ICD-10-CM | POA: Insufficient documentation

## 2018-07-21 HISTORY — DX: Unspecified ovarian cyst, right side: N83.201

## 2018-07-21 HISTORY — DX: Headache, unspecified: R51.9

## 2018-07-21 HISTORY — DX: Personal history of urinary calculi: Z87.442

## 2018-07-21 HISTORY — DX: Headache: R51

## 2018-07-21 HISTORY — DX: Unspecified ovarian cyst, left side: N83.202

## 2018-07-21 LAB — COMPREHENSIVE METABOLIC PANEL
ALBUMIN: 3.9 g/dL (ref 3.5–5.0)
ALK PHOS: 69 U/L (ref 38–126)
ALT: 28 U/L (ref 0–44)
ANION GAP: 11 (ref 5–15)
AST: 19 U/L (ref 15–41)
BILIRUBIN TOTAL: 0.6 mg/dL (ref 0.3–1.2)
BUN: 12 mg/dL (ref 6–20)
CALCIUM: 9.6 mg/dL (ref 8.9–10.3)
CO2: 23 mmol/L (ref 22–32)
CREATININE: 0.86 mg/dL (ref 0.44–1.00)
Chloride: 101 mmol/L (ref 98–111)
GFR calc Af Amer: >60 mL/min (ref >60)
GFR calc non Af Amer: >60 mL/min (ref >60)
GLUCOSE: 97 mg/dL (ref 70–99)
Potassium: 3.8 mmol/L (ref 3.5–5.1)
Sodium: 135 mmol/L (ref 135–145)
TOTAL PROTEIN: 7.7 g/dL (ref 6.5–8.1)

## 2018-07-21 LAB — URINALYSIS, ROUTINE W REFLEX MICROSCOPIC
BILIRUBIN URINE: NEGATIVE
Glucose, UA: NEGATIVE mg/dL
HGB URINE DIPSTICK: NEGATIVE
KETONES UR: NEGATIVE mg/dL
Nitrite: NEGATIVE
PROTEIN: NEGATIVE mg/dL
Specific Gravity, Urine: 1.004 — ABNORMAL LOW (ref 1.005–1.030)
pH: 7 (ref 5.0–8.0)

## 2018-07-21 LAB — CBC
HCT: 50.2 % — ABNORMAL HIGH (ref 36.0–46.0)
Hemoglobin: 16.6 g/dL — ABNORMAL HIGH (ref 12.0–15.0)
MCH: 28.5 pg (ref 26.0–34.0)
MCHC: 33.1 g/dL (ref 30.0–36.0)
MCV: 86.1 fL (ref 80.0–100.0)
Platelets: 296 10*3/uL (ref 150–400)
RBC: 5.83 MIL/uL — ABNORMAL HIGH (ref 3.87–5.11)
RDW: 13.3 % (ref 11.5–15.5)
WBC: 8.8 10*3/uL (ref 4.0–10.5)
nRBC: 0 % (ref 0.0–0.2)

## 2018-07-21 NOTE — Progress Notes (Signed)
EKG 05-30-18 on chart from [facility]

## 2018-07-22 LAB — CEA: CEA: 3.8 ng/mL (ref 0.0–4.7)

## 2018-07-22 LAB — ABO/RH: ABO/RH(D): A POS

## 2018-07-22 LAB — CA 125: Cancer Antigen (CA) 125: 133 U/mL — ABNORMAL HIGH (ref 0.0–38.1)

## 2018-07-25 ENCOUNTER — Inpatient Hospital Stay (HOSPITAL_COMMUNITY): Payer: Self-pay | Admitting: Certified Registered"

## 2018-07-25 ENCOUNTER — Other Ambulatory Visit: Payer: Self-pay

## 2018-07-25 ENCOUNTER — Encounter (HOSPITAL_COMMUNITY): Payer: Self-pay | Admitting: *Deleted

## 2018-07-25 ENCOUNTER — Inpatient Hospital Stay (HOSPITAL_COMMUNITY)
Admission: RE | Admit: 2018-07-25 | Discharge: 2018-07-27 | DRG: 742 | Disposition: A | Discharge status: Home / Self Care | Payer: Medicaid Other | Attending: Obstetrics | Admitting: Obstetrics

## 2018-07-25 ENCOUNTER — Encounter (HOSPITAL_COMMUNITY)
Admission: RE | Disposition: A | Discharge status: Home / Self Care | Payer: Self-pay | Source: Home / Self Care | Attending: Gynecologic Oncology

## 2018-07-25 DIAGNOSIS — M479 Spondylosis, unspecified: Secondary | ICD-10-CM | POA: Diagnosis present

## 2018-07-25 DIAGNOSIS — M170 Bilateral primary osteoarthritis of knee: Secondary | ICD-10-CM | POA: Diagnosis present

## 2018-07-25 DIAGNOSIS — F17210 Nicotine dependence, cigarettes, uncomplicated: Secondary | ICD-10-CM | POA: Diagnosis present

## 2018-07-25 DIAGNOSIS — Z8261 Family history of arthritis: Secondary | ICD-10-CM

## 2018-07-25 DIAGNOSIS — D271 Benign neoplasm of left ovary: Principal | ICD-10-CM | POA: Diagnosis present

## 2018-07-25 DIAGNOSIS — Z79899 Other long term (current) drug therapy: Secondary | ICD-10-CM

## 2018-07-25 DIAGNOSIS — N83201 Unspecified ovarian cyst, right side: Secondary | ICD-10-CM

## 2018-07-25 DIAGNOSIS — F419 Anxiety disorder, unspecified: Secondary | ICD-10-CM | POA: Diagnosis present

## 2018-07-25 DIAGNOSIS — K439 Ventral hernia without obstruction or gangrene: Secondary | ICD-10-CM

## 2018-07-25 DIAGNOSIS — F329 Major depressive disorder, single episode, unspecified: Secondary | ICD-10-CM | POA: Diagnosis present

## 2018-07-25 DIAGNOSIS — Z6839 Body mass index (BMI) 39.0-39.9, adult: Secondary | ICD-10-CM

## 2018-07-25 DIAGNOSIS — D270 Benign neoplasm of right ovary: Secondary | ICD-10-CM

## 2018-07-25 DIAGNOSIS — N83202 Unspecified ovarian cyst, left side: Secondary | ICD-10-CM

## 2018-07-25 DIAGNOSIS — R1900 Intra-abdominal and pelvic swelling, mass and lump, unspecified site: Secondary | ICD-10-CM | POA: Diagnosis present

## 2018-07-25 DIAGNOSIS — Z780 Asymptomatic menopausal state: Secondary | ICD-10-CM

## 2018-07-25 DIAGNOSIS — K436 Other and unspecified ventral hernia with obstruction, without gangrene: Secondary | ICD-10-CM

## 2018-07-25 DIAGNOSIS — F410 Panic disorder [episodic paroxysmal anxiety] without agoraphobia: Secondary | ICD-10-CM | POA: Diagnosis present

## 2018-07-25 DIAGNOSIS — I10 Essential (primary) hypertension: Secondary | ICD-10-CM | POA: Diagnosis present

## 2018-07-25 DIAGNOSIS — N200 Calculus of kidney: Secondary | ICD-10-CM | POA: Diagnosis present

## 2018-07-25 DIAGNOSIS — N838 Other noninflammatory disorders of ovary, fallopian tube and broad ligament: Secondary | ICD-10-CM

## 2018-07-25 HISTORY — PX: SALPINGOOPHORECTOMY: SHX82

## 2018-07-25 HISTORY — PX: VENTRAL HERNIA REPAIR: SHX424

## 2018-07-25 HISTORY — PX: LAPAROTOMY: SHX154

## 2018-07-25 LAB — TYPE AND SCREEN
ABO/RH(D): A POS
Antibody Screen: NEGATIVE

## 2018-07-25 SURGERY — LAPAROTOMY, EXPLORATORY
Anesthesia: General

## 2018-07-25 MED ORDER — EPHEDRINE SULFATE-NACL 50-0.9 MG/10ML-% IV SOSY
PREFILLED_SYRINGE | INTRAVENOUS | Status: DC | PRN
  Administered 2018-07-25: 5 mg via INTRAVENOUS
  Administered 2018-07-25 (×2): 10 mg via INTRAVENOUS

## 2018-07-25 MED ORDER — OXYCODONE HCL 5 MG/5ML PO SOLN
5.0000 mg | Freq: Once | ORAL | Status: DC | PRN
  Filled 2018-07-25: qty 5

## 2018-07-25 MED ORDER — SUGAMMADEX SODIUM 500 MG/5ML IV SOLN
INTRAVENOUS | Status: AC
  Filled 2018-07-25: qty 5

## 2018-07-25 MED ORDER — ONDANSETRON HCL 4 MG/2ML IJ SOLN: 4.0000 mg | Freq: Four times a day (QID) | INTRAMUSCULAR | Status: DC | PRN

## 2018-07-25 MED ORDER — BUPIVACAINE HCL (PF) 0.25 % IJ SOLN
INTRAMUSCULAR | Status: DC | PRN
  Administered 2018-07-25: 20 mL

## 2018-07-25 MED ORDER — ACETAMINOPHEN 500 MG PO TABS
1000.0000 mg | ORAL_TABLET | Freq: Four times a day (QID) | ORAL | Status: DC
  Administered 2018-07-25 – 2018-07-27 (×6): 1000 mg via ORAL
  Filled 2018-07-25 (×6): qty 2

## 2018-07-25 MED ORDER — ONDANSETRON HCL 4 MG PO TABS: 4.0000 mg | ORAL_TABLET | Freq: Four times a day (QID) | ORAL | Status: DC | PRN

## 2018-07-25 MED ORDER — ENSURE ENLIVE PO LIQD
237.0000 mL | Freq: Two times a day (BID) | ORAL | Status: DC
  Administered 2018-07-26 – 2018-07-27 (×2): 237 mL via ORAL

## 2018-07-25 MED ORDER — CEFAZOLIN SODIUM-DEXTROSE 2-4 GM/100ML-% IV SOLN
2.0000 g | INTRAVENOUS | Status: AC
  Administered 2018-07-25: 2 g via INTRAVENOUS
  Filled 2018-07-25: qty 100

## 2018-07-25 MED ORDER — ACETAMINOPHEN 500 MG PO TABS
1000.0000 mg | ORAL_TABLET | ORAL | Status: AC
  Administered 2018-07-25: 1000 mg via ORAL
  Filled 2018-07-25: qty 2

## 2018-07-25 MED ORDER — KETAMINE HCL 10 MG/ML IJ SOLN
INTRAMUSCULAR | Status: DC | PRN
  Administered 2018-07-25 (×2): 20 mg via INTRAVENOUS

## 2018-07-25 MED ORDER — ROCURONIUM BROMIDE 10 MG/ML (PF) SYRINGE
PREFILLED_SYRINGE | INTRAVENOUS | Status: AC
  Filled 2018-07-25: qty 10

## 2018-07-25 MED ORDER — METOPROLOL TARTRATE 50 MG PO TABS
50.0000 mg | ORAL_TABLET | Freq: Every day | ORAL | Status: DC
  Administered 2018-07-26 – 2018-07-27 (×2): 50 mg via ORAL
  Filled 2018-07-25 (×2): qty 1

## 2018-07-25 MED ORDER — KCL IN DEXTROSE-NACL 20-5-0.45 MEQ/L-%-% IV SOLN
INTRAVENOUS | Status: DC
  Administered 2018-07-25 – 2018-07-26 (×2): via INTRAVENOUS
  Filled 2018-07-25 (×2): qty 1000

## 2018-07-25 MED ORDER — PHENYLEPHRINE 40 MCG/ML (10ML) SYRINGE FOR IV PUSH (FOR BLOOD PRESSURE SUPPORT)
PREFILLED_SYRINGE | INTRAVENOUS | Status: DC | PRN
  Administered 2018-07-25: 80 ug via INTRAVENOUS

## 2018-07-25 MED ORDER — HYDROMORPHONE HCL 1 MG/ML IJ SOLN: 0.5000 mg | INTRAMUSCULAR | Status: DC | PRN

## 2018-07-25 MED ORDER — CHEWING GUM (ORBIT) SUGAR FREE
1.0000 | CHEWING_GUM | Freq: Three times a day (TID) | ORAL | Status: DC
  Administered 2018-07-25 – 2018-07-27 (×4): 1 via ORAL
  Filled 2018-07-25 (×2): qty 1

## 2018-07-25 MED ORDER — ALBUTEROL SULFATE (2.5 MG/3ML) 0.083% IN NEBU: 3.0000 mL | INHALATION_SOLUTION | Freq: Four times a day (QID) | RESPIRATORY_TRACT | Status: DC | PRN

## 2018-07-25 MED ORDER — CELECOXIB 200 MG PO CAPS
400.0000 mg | ORAL_CAPSULE | ORAL | Status: AC
  Administered 2018-07-25: 400 mg via ORAL
  Filled 2018-07-25: qty 2

## 2018-07-25 MED ORDER — BUPIVACAINE HCL (PF) 0.25 % IJ SOLN
INTRAMUSCULAR | Status: AC
  Filled 2018-07-25: qty 30

## 2018-07-25 MED ORDER — IBUPROFEN 800 MG PO TABS
800.0000 mg | ORAL_TABLET | Freq: Three times a day (TID) | ORAL | Status: DC
  Administered 2018-07-26 – 2018-07-27 (×4): 800 mg via ORAL
  Filled 2018-07-25 (×4): qty 1

## 2018-07-25 MED ORDER — HYDROMORPHONE HCL 1 MG/ML IJ SOLN
INTRAMUSCULAR | Status: AC
  Filled 2018-07-25: qty 1

## 2018-07-25 MED ORDER — FENTANYL CITRATE (PF) 250 MCG/5ML IJ SOLN
INTRAMUSCULAR | Status: DC | PRN
  Administered 2018-07-25: 25 ug via INTRAVENOUS
  Administered 2018-07-25 (×2): 50 ug via INTRAVENOUS
  Administered 2018-07-25: 25 ug via INTRAVENOUS
  Administered 2018-07-25: 50 ug via INTRAVENOUS

## 2018-07-25 MED ORDER — FENTANYL CITRATE (PF) 100 MCG/2ML IJ SOLN
INTRAMUSCULAR | Status: AC
  Filled 2018-07-25: qty 2

## 2018-07-25 MED ORDER — PREGABALIN 75 MG PO CAPS
75.0000 mg | ORAL_CAPSULE | Freq: Two times a day (BID) | ORAL | Status: DC
  Administered 2018-07-26 – 2018-07-27 (×3): 75 mg via ORAL
  Filled 2018-07-25 (×3): qty 1

## 2018-07-25 MED ORDER — DEXAMETHASONE SODIUM PHOSPHATE 4 MG/ML IJ SOLN
4.0000 mg | INTRAMUSCULAR | Status: AC
  Administered 2018-07-25: 8 mg via INTRAVENOUS

## 2018-07-25 MED ORDER — PROPOFOL 10 MG/ML IV BOLUS
INTRAVENOUS | Status: DC | PRN
  Administered 2018-07-25: 170 mg via INTRAVENOUS

## 2018-07-25 MED ORDER — PHENYLEPHRINE 40 MCG/ML (10ML) SYRINGE FOR IV PUSH (FOR BLOOD PRESSURE SUPPORT)
PREFILLED_SYRINGE | INTRAVENOUS | Status: AC
  Filled 2018-07-25: qty 10

## 2018-07-25 MED ORDER — PROMETHAZINE HCL 25 MG/ML IJ SOLN: 6.2500 mg | INTRAMUSCULAR | Status: DC | PRN

## 2018-07-25 MED ORDER — LIDOCAINE 2% (20 MG/ML) 5 ML SYRINGE
INTRAMUSCULAR | Status: AC
  Filled 2018-07-25: qty 5

## 2018-07-25 MED ORDER — ENOXAPARIN SODIUM 40 MG/0.4ML ~~LOC~~ SOLN
40.0000 mg | SUBCUTANEOUS | Status: DC
  Administered 2018-07-26 – 2018-07-27 (×2): 40 mg via SUBCUTANEOUS
  Filled 2018-07-25 (×2): qty 0.4

## 2018-07-25 MED ORDER — SODIUM CHLORIDE (PF) 0.9 % IJ SOLN
INTRAMUSCULAR | Status: DC | PRN
  Administered 2018-07-25: 10 mL

## 2018-07-25 MED ORDER — SUGAMMADEX SODIUM 200 MG/2ML IV SOLN
INTRAVENOUS | Status: AC
  Filled 2018-07-25: qty 2

## 2018-07-25 MED ORDER — BUPIVACAINE LIPOSOME 1.3 % IJ SUSP
20.0000 mL | Freq: Once | INTRAMUSCULAR | Status: AC
  Administered 2018-07-25: 20 mL
  Filled 2018-07-25: qty 20

## 2018-07-25 MED ORDER — LISINOPRIL 20 MG PO TABS
20.0000 mg | ORAL_TABLET | Freq: Every day | ORAL | Status: DC
  Administered 2018-07-25 – 2018-07-27 (×3): 20 mg via ORAL
  Filled 2018-07-25 (×3): qty 1

## 2018-07-25 MED ORDER — SUGAMMADEX SODIUM 200 MG/2ML IV SOLN
INTRAVENOUS | Status: DC | PRN
  Administered 2018-07-25: 250 mg via INTRAVENOUS

## 2018-07-25 MED ORDER — SCOPOLAMINE 1 MG/3DAYS TD PT72
1.0000 | MEDICATED_PATCH | TRANSDERMAL | Status: DC
  Administered 2018-07-25: 1.5 mg via TRANSDERMAL
  Filled 2018-07-25: qty 1

## 2018-07-25 MED ORDER — LIDOCAINE 2% (20 MG/ML) 5 ML SYRINGE
INTRAMUSCULAR | Status: DC | PRN
  Administered 2018-07-25: 1.5 mg/kg/h via INTRAVENOUS

## 2018-07-25 MED ORDER — OXYCODONE HCL 5 MG PO TABS: 5.0000 mg | ORAL_TABLET | Freq: Once | ORAL | Status: DC | PRN

## 2018-07-25 MED ORDER — LACTATED RINGERS IV SOLN
INTRAVENOUS | Status: DC
  Administered 2018-07-25: 13:00:00 via INTRAVENOUS

## 2018-07-25 MED ORDER — MIDAZOLAM HCL 2 MG/2ML IJ SOLN
INTRAMUSCULAR | Status: AC
  Filled 2018-07-25: qty 2

## 2018-07-25 MED ORDER — SENNOSIDES-DOCUSATE SODIUM 8.6-50 MG PO TABS
2.0000 | ORAL_TABLET | Freq: Every day | ORAL | Status: DC
  Administered 2018-07-25 – 2018-07-26 (×2): 2 via ORAL
  Filled 2018-07-25 (×2): qty 2

## 2018-07-25 MED ORDER — NON FORMULARY: 1.0000 [IU] | Freq: Three times a day (TID) | Status: DC

## 2018-07-25 MED ORDER — TRAMADOL HCL 50 MG PO TABS: 100.0000 mg | ORAL_TABLET | Freq: Four times a day (QID) | ORAL | Status: DC | PRN

## 2018-07-25 MED ORDER — HYDROMORPHONE HCL 1 MG/ML IJ SOLN
0.2500 mg | INTRAMUSCULAR | Status: DC | PRN
  Administered 2018-07-25 (×2): 0.5 mg via INTRAVENOUS

## 2018-07-25 MED ORDER — 0.9 % SODIUM CHLORIDE (POUR BTL) OPTIME
TOPICAL | Status: DC | PRN
  Administered 2018-07-25: 1000 mL

## 2018-07-25 MED ORDER — ENOXAPARIN SODIUM 40 MG/0.4ML ~~LOC~~ SOLN
40.0000 mg | SUBCUTANEOUS | Status: AC
  Administered 2018-07-25: 40 mg via SUBCUTANEOUS
  Filled 2018-07-25: qty 0.4

## 2018-07-25 MED ORDER — MIDAZOLAM HCL 2 MG/2ML IJ SOLN
INTRAMUSCULAR | Status: DC | PRN
  Administered 2018-07-25: 2 mg via INTRAVENOUS

## 2018-07-25 MED ORDER — GABAPENTIN 300 MG PO CAPS
300.0000 mg | ORAL_CAPSULE | ORAL | Status: AC
  Administered 2018-07-25: 300 mg via ORAL
  Filled 2018-07-25: qty 1

## 2018-07-25 MED ORDER — OXYCODONE HCL 5 MG PO TABS
5.0000 mg | ORAL_TABLET | ORAL | Status: DC | PRN
  Administered 2018-07-26 (×3): 5 mg via ORAL
  Filled 2018-07-25 (×3): qty 1

## 2018-07-25 MED ORDER — ONDANSETRON HCL 4 MG/2ML IJ SOLN
INTRAMUSCULAR | Status: DC | PRN
  Administered 2018-07-25: 4 mg via INTRAVENOUS

## 2018-07-25 MED ORDER — LIDOCAINE 2% (20 MG/ML) 5 ML SYRINGE
INTRAMUSCULAR | Status: DC | PRN
  Administered 2018-07-25: 80 mg via INTRAVENOUS

## 2018-07-25 MED ORDER — KETAMINE HCL 10 MG/ML IJ SOLN
INTRAMUSCULAR | Status: AC
  Filled 2018-07-25: qty 1

## 2018-07-25 MED ORDER — SODIUM CHLORIDE (PF) 0.9 % IJ SOLN
INTRAMUSCULAR | Status: AC
  Filled 2018-07-25: qty 50

## 2018-07-25 MED ORDER — ROCURONIUM BROMIDE 10 MG/ML (PF) SYRINGE
PREFILLED_SYRINGE | INTRAVENOUS | Status: DC | PRN
  Administered 2018-07-25: 50 mg via INTRAVENOUS

## 2018-07-25 MED ORDER — FENTANYL CITRATE (PF) 250 MCG/5ML IJ SOLN
INTRAMUSCULAR | Status: AC
  Filled 2018-07-25: qty 5

## 2018-07-25 SURGICAL SUPPLY — 70 items
ADH SKN CLS APL DERMABOND .7 (GAUZE/BANDAGES/DRESSINGS) ×2
AGENT HMST MTR 8 SURGIFLO (HEMOSTASIS)
ATTRACTOMAT 16X20 MAGNETIC DRP (DRAPES) ×1 IMPLANT
BINDER ABDOMINAL 12 ML 46-62 (SOFTGOODS) ×1 IMPLANT
BLADE EXTENDED COATED 6.5IN (ELECTRODE) ×2 IMPLANT
CELLS DAT CNTRL 66122 CELL SVR (MISCELLANEOUS) ×2 IMPLANT
CHLORAPREP W/TINT 26ML (MISCELLANEOUS) ×4 IMPLANT
CLIP VESOCCLUDE LG 6/CT (CLIP) ×3 IMPLANT
CLIP VESOCCLUDE MED 6/CT (CLIP) ×3 IMPLANT
CLIP VESOCCLUDE MED LG 6/CT (CLIP) ×3 IMPLANT
CONT SPEC 4OZ CLIKSEAL STRL BL (MISCELLANEOUS) ×1 IMPLANT
COVER WAND RF STERILE (DRAPES) IMPLANT
DECANTER SPIKE VIAL GLASS SM (MISCELLANEOUS) ×1 IMPLANT
DERMABOND ADVANCED (GAUZE/BANDAGES/DRESSINGS) ×1
DERMABOND ADVANCED .7 DNX12 (GAUZE/BANDAGES/DRESSINGS) IMPLANT
DRAPE INCISE IOBAN 66X45 STRL (DRAPES) IMPLANT
DRAPE WARM FLUID 44X44 (DRAPE) ×3 IMPLANT
DRSG OPSITE POSTOP 4X10 (GAUZE/BANDAGES/DRESSINGS) IMPLANT
DRSG OPSITE POSTOP 4X6 (GAUZE/BANDAGES/DRESSINGS) IMPLANT
DRSG OPSITE POSTOP 4X8 (GAUZE/BANDAGES/DRESSINGS) ×1 IMPLANT
ELECT REM PT RETURN 15FT ADLT (MISCELLANEOUS) ×3 IMPLANT
GAUZE 4X4 16PLY RFD (DISPOSABLE) IMPLANT
GLOVE BIO SURGEON STRL SZ 6 (GLOVE) ×6 IMPLANT
GLOVE BIO SURGEON STRL SZ 6.5 (GLOVE) ×6 IMPLANT
GOWN STRL REUS W/ TWL LRG LVL3 (GOWN DISPOSABLE) ×4 IMPLANT
GOWN STRL REUS W/TWL LRG LVL3 (GOWN DISPOSABLE) ×6
HANDLE SUCTION POOLE (INSTRUMENTS) IMPLANT
HEMOSTAT ARISTA ABSORB 3G PWDR (MISCELLANEOUS) IMPLANT
KIT BASIN OR (CUSTOM PROCEDURE TRAY) ×3 IMPLANT
LIGASURE IMPACT 36 18CM CVD LR (INSTRUMENTS) ×1 IMPLANT
LOOP VESSEL MAXI BLUE (MISCELLANEOUS) IMPLANT
NEEDLE HYPO 22GX1.5 SAFETY (NEEDLE) ×6 IMPLANT
NS IRRIG 1000ML POUR BTL (IV SOLUTION) ×5 IMPLANT
PACK GENERAL/GYN (CUSTOM PROCEDURE TRAY) ×3 IMPLANT
RELOAD PROXIMATE 75MM BLUE (ENDOMECHANICALS) IMPLANT
RELOAD PROXIMATE TA60MM BLUE (ENDOMECHANICALS) IMPLANT
RELOAD STAPLE 60 BLU REG PROX (ENDOMECHANICALS) IMPLANT
RELOAD STAPLE 75 3.8 BLU REG (ENDOMECHANICALS) IMPLANT
RETRACTOR WND ALEXIS 18 MED (MISCELLANEOUS) IMPLANT
RETRACTOR WND ALEXIS 25 LRG (MISCELLANEOUS) IMPLANT
RTRCTR WOUND ALEXIS 18CM MED (MISCELLANEOUS) ×3
RTRCTR WOUND ALEXIS 25CM LRG (MISCELLANEOUS)
SHEET LAVH (DRAPES) ×3 IMPLANT
SPOGE SURGIFLO 8M (HEMOSTASIS)
SPONGE LAP 18X18 RF (DISPOSABLE) ×1 IMPLANT
SPONGE SURGIFLO 8M (HEMOSTASIS) IMPLANT
STAPLER GUN LINEAR PROX 60 (STAPLE) IMPLANT
STAPLER PROXIMATE 75MM BLUE (STAPLE) IMPLANT
STAPLER VISISTAT 35W (STAPLE) IMPLANT
SUCTION POOLE HANDLE (INSTRUMENTS) ×3
SUT MNCRL AB 4-0 PS2 18 (SUTURE) ×6 IMPLANT
SUT PDS AB 1 TP1 96 (SUTURE) ×6 IMPLANT
SUT PROLENE 1 CTX 30  8455H (SUTURE) ×3
SUT PROLENE 1 CTX 30 8455H (SUTURE) IMPLANT
SUT SILK 3 0 SH CR/8 (SUTURE) IMPLANT
SUT VIC AB 0 CT1 36 (SUTURE) ×8 IMPLANT
SUT VIC AB 2-0 CT1 36 (SUTURE) ×7 IMPLANT
SUT VIC AB 2-0 CT2 27 (SUTURE) ×14 IMPLANT
SUT VIC AB 2-0 SH 27 (SUTURE)
SUT VIC AB 2-0 SH 27X BRD (SUTURE) IMPLANT
SUT VIC AB 3-0 CTX 36 (SUTURE) IMPLANT
SUT VIC AB 3-0 SH 18 (SUTURE) IMPLANT
SUT VIC AB 3-0 SH 27 (SUTURE) ×9
SUT VIC AB 3-0 SH 27X BRD (SUTURE) ×2 IMPLANT
SYR 30ML LL (SYRINGE) ×6 IMPLANT
SYR BULB IRRIGATION 50ML (SYRINGE) ×1 IMPLANT
TOWEL OR 17X26 10 PK STRL BLUE (TOWEL DISPOSABLE) ×3 IMPLANT
TOWEL OR NON WOVEN STRL DISP B (DISPOSABLE) ×2 IMPLANT
TRAY FOLEY MTR SLVR 16FR STAT (SET/KITS/TRAYS/PACK) ×3 IMPLANT
UNDERPAD 30X30 (UNDERPADS AND DIAPERS) ×3 IMPLANT

## 2018-07-25 NOTE — Anesthesia Preprocedure Evaluation (Addendum)
Anesthesia Evaluation  Patient identified by MRN, date of birth, ID band Patient awake    Reviewed: Allergy & Precautions, NPO status , Patient's Chart, lab work & pertinent test results, reviewed documented beta blocker date and time   Airway Mallampati: III  TM Distance: >3 FB Neck ROM: Full    Dental no notable dental hx.    Pulmonary COPD (prn inhaler, mild controlled),  COPD inhaler, Current Smoker,    Pulmonary exam normal breath sounds clear to auscultation       Cardiovascular hypertension, Pt. on home beta blockers and Pt. on medications Normal cardiovascular exam Rhythm:Regular Rate:Normal  ECG: SR, rate 64   Neuro/Psych  Headaches, PSYCHIATRIC DISORDERS Anxiety Depression    GI/Hepatic negative GI ROS, Neg liver ROS,   Endo/Other  Morbid obesity  Renal/GU negative Renal ROS     Musculoskeletal negative musculoskeletal ROS (+)   Abdominal (+) + obese,   Peds  Hematology negative hematology ROS (+)   Anesthesia Other Findings BILATERAL OVARIAN CYST  Reproductive/Obstetrics S/p tubal ligation                            Anesthesia Physical Anesthesia Plan  ASA: III  Anesthesia Plan: General   Post-op Pain Management:    Induction: Intravenous  PONV Risk Score and Plan: 3 and Ondansetron, Dexamethasone, Midazolam and Treatment may vary due to age or medical condition  Airway Management Planned: Oral ETT  Additional Equipment:   Intra-op Plan:   Post-operative Plan: Extubation in OR  Informed Consent: I have reviewed the patients History and Physical, chart, labs and discussed the procedure including the risks, benefits and alternatives for the proposed anesthesia with the patient or authorized representative who has indicated his/her understanding and acceptance.   Dental advisory given  Plan Discussed with: CRNA  Anesthesia Plan Comments:         Anesthesia  Quick Evaluation

## 2018-07-25 NOTE — Transfer of Care (Signed)
Immediate Anesthesia Transfer of Care Note  Patient: Melanie Gallegos  Procedure(s) Performed: EXPLORATORY LAPAROTOMY (N/A ) SALPINGO OOPHORECTOMY (Bilateral ) HERNIA REPAIR VENTRAL ADULT (N/A )  Patient Location: PACU  Anesthesia Type:General  Level of Consciousness: awake  Airway & Oxygen Therapy: Patient Spontanous Breathing and Patient connected to face mask oxygen  Post-op Assessment: Report given to RN and Post -op Vital signs reviewed and stable  Post vital signs: Reviewed and stable  Last Vitals:  Vitals Value Taken Time  BP 142/71 07/25/2018  4:17 PM  Temp    Pulse 79 07/25/2018  4:19 PM  Resp 17 07/25/2018  4:19 PM  SpO2 100 % 07/25/2018  4:19 PM  Vitals shown include unvalidated device data.  Last Pain:  Vitals:   07/25/18 1230  TempSrc:   PainSc: 0-No pain         Complications: No apparent anesthesia complications

## 2018-07-25 NOTE — Anesthesia Procedure Notes (Signed)
Procedure Name: Intubation Date/Time: 07/25/2018 2:32 PM Performed by: Victoriano Lain, CRNA Pre-anesthesia Checklist: Patient identified, Emergency Drugs available, Suction available, Patient being monitored and Timeout performed Patient Re-evaluated:Patient Re-evaluated prior to induction Oxygen Delivery Method: Circle system utilized Preoxygenation: Pre-oxygenation with 100% oxygen Induction Type: IV induction Ventilation: Mask ventilation without difficulty Laryngoscope Size: Mac and 3 Grade View: Grade I Tube type: Oral Tube size: 7.0 mm Number of attempts: 1 Airway Equipment and Method: Stylet Placement Confirmation: ETT inserted through vocal cords under direct vision,  positive ETCO2 and breath sounds checked- equal and bilateral Secured at: 21 cm Tube secured with: Tape Dental Injury: Teeth and Oropharynx as per pre-operative assessment

## 2018-07-25 NOTE — Interval H&P Note (Signed)
History and Physical Interval Note:  07/25/2018 1:52 PM  Melanie Gallegos  has presented today for surgery, with the diagnosis of BILATERAL OVARIAN CYST  The various methods of treatment have been discussed with the patient and family. After consideration of risks, benefits and other options for treatment, the patient has consented to  Procedure(s): EXPLORATORY LAPAROTOMY (N/A) SALPINGO OOPHORECTOMY POSS STAGING (Bilateral) as a surgical intervention .  The patient's history has been reviewed, patient examined, no change in status, stable for surgery.  I have reviewed the patient's chart and labs.  CA 125 is mildly elevated. CEA upper limits of normal. Questions were answered to the patient's satisfaction.     Thereasa Solo

## 2018-07-25 NOTE — Op Note (Signed)
OPERATIVE NOTE Date: 07/25/18 Preoperative Diagnosis:  Bilateral ovarian masses, ventral abdominal hernia   Postoperative Diagnosis:same    Procedure(s) Performed: Exploratory laparotomy with bilateral salpingo-oophorectomy, ventral hernia repair.  Surgeon: Thereasa Solo, MD.  Assistant Surgeon: Lahoma Crocker, M.D. Assistant: (an MD assistant was necessary for tissue manipulation, retraction and positioning due to the complexity of the case and hospital policies).   Specimens: left and right tube and ovary, washings, hernia sac   Estimated Blood Loss: 50 mL.    Urine [id number]  Complications: None.   Operative Findings: 7cm supraumbilical midline ventral abdominal hernia with incarcerated omentum. 30cm left ovarian cystic mass, 7cm cystic right paratubal cyst and 5cm cystic ovarian mass. All benign cystadenofibromas on frozen section.    Procedure:   The patient was seen in the Holding Room. The risks, benefits, complications, treatment options, and expected outcomes were discussed with the patient.  The patient concurred with the proposed plan, giving informed consent.   The patient was  identified as Melanie Gallegos  and the procedure verified as BSO, possible staging. A Time Out was held and the above information confirmed upon entry to the operating room..  After induction of anesthesia, the patient was draped and prepped in the usual sterile manner.  She was prepped and draped in the normal sterile fashion in the dorsal lithotomy position in padded Allen stirrups with good attention paid to support of the lower back and lower extremities. Position was adjusted for appropriate support. A Foley catheter was placed to gravity.   A midline vertical incision was made and carried through the subcutaneous tissue to the fascia. The fascial incision was made and extended superiorally. The rectus muscles were separated. The peritoneum was identified and entered. Peritoneal incision was  extended longitudinally.  The abdominal cavity was entered sharply and without incident. An Alexis retractor was then placed. A survey of the abdomen and pelvis revealed the above findings, which were significant for bilateral ovarian cysts, the larger being from the left ovary and measuring approximately 30cm.  Washings were obtained from the peritoneal cavity.  An alexis retractor was placed.  The cystic mass was identified and brought to the incision. A gallbladder trochar was used to puncture the cyst and evacuate it of fluid to decompress it. No gross spillage of cyst fluid took place.Allis clamps were used to elevated the puncture site as the cyst was deflated.  The deflated cystic mass was delivered through the abdominal incision.  A window was made in the medial leaf of the broad ligament above the level of the ureter. The left utero-ovarian ligament and infundibulopelvic ligament were skeletonized and sealed and transected with the ligasure. This liberated the ovary from its attachments. It was sent for frozen section which revealed benign cystadenofibroma. Hemostasis was observed at the utero-ovarian and IP ligaments.  The retroperitoneum on the right side was opened and the ureter was identified in the medial leaf of the broad ligament deep to the surgical pedicles. The IP ligament and utero-ovarian ligament on the right were skeletonized above the ureter, then sealed and transected with ligasure. This ovary and tube (right) were sent for frozen section which was also benign. The uterus was inspected and noted to be normal.  The peritoneal cavity was irrigated and hemostasis was confirmed at all surgical sites.  The ventral hernia sac was carefully dissected from the surrounding edges and the fascial edges circumferentially were exposed and freshened. The sac was sent for pathology. The fascial edges of the hernia  sac were reapproximated with #1 PDS running mass closure in 3 sutures. The  reaminder of the incision was closed with looped PDS. The subcutaneous layer was then irrigated copiously.  Exparel long acting local anesthetic was infiltrated into the subcutaneous tissues. The skin was closed with subcuticular suture. The patient tolerated the procedure well.   Sponge, lap and needle counts were correct x 2.   Donaciano Eva, MD

## 2018-07-25 NOTE — Anesthesia Postprocedure Evaluation (Signed)
Anesthesia Post Note  Patient: Melanie Gallegos  Procedure(s) Performed: EXPLORATORY LAPAROTOMY (N/A ) SALPINGO OOPHORECTOMY (Bilateral ) HERNIA REPAIR VENTRAL ADULT (N/A )     Patient location during evaluation: PACU Anesthesia Type: General Level of consciousness: awake and alert Pain management: pain level controlled Vital Signs Assessment: post-procedure vital signs reviewed and stable Respiratory status: spontaneous breathing, nonlabored ventilation, respiratory function stable and patient connected to nasal cannula oxygen Cardiovascular status: blood pressure returned to baseline and stable Postop Assessment: no apparent nausea or vomiting Anesthetic complications: no    Last Vitals:  Vitals:   07/25/18 1720 07/25/18 1737  BP: 134/67 (!) 142/70  Pulse: 70 72  Resp: 16 18  Temp: 36.6 C   SpO2: 93% 94%    Last Pain:  Vitals:   07/25/18 1737  TempSrc:   PainSc: 6                  Ryan P Ellender

## 2018-07-26 ENCOUNTER — Encounter (HOSPITAL_COMMUNITY): Payer: Self-pay | Admitting: Gynecologic Oncology

## 2018-07-26 LAB — CBC
HEMATOCRIT: 44.8 % (ref 36.0–46.0)
HEMOGLOBIN: 14.6 g/dL (ref 12.0–15.0)
MCH: 28.3 pg (ref 26.0–34.0)
MCHC: 32.6 g/dL (ref 30.0–36.0)
MCV: 86.8 fL (ref 80.0–100.0)
NRBC: 0 % (ref 0.0–0.2)
Platelets: 233 10*3/uL (ref 150–400)
RBC: 5.16 MIL/uL — AB (ref 3.87–5.11)
RDW: 13 % (ref 11.5–15.5)
WBC: 19 10*3/uL — AB (ref 4.0–10.5)

## 2018-07-26 LAB — BASIC METABOLIC PANEL
ANION GAP: 9 (ref 5–15)
BUN: 11 mg/dL (ref 6–20)
CO2: 22 mmol/L (ref 22–32)
CREATININE: 0.84 mg/dL (ref 0.44–1.00)
Calcium: 8.9 mg/dL (ref 8.9–10.3)
Chloride: 103 mmol/L (ref 98–111)
GFR calc non Af Amer: >60 mL/min (ref >60)
Glucose, Bld: 201 mg/dL — ABNORMAL HIGH (ref 70–99)
Potassium: 3.5 mmol/L (ref 3.5–5.1)
SODIUM: 134 mmol/L — AB (ref 135–145)

## 2018-07-26 NOTE — Progress Notes (Signed)
1 Day Post-Op Procedure(s) (LRB): EXPLORATORY LAPAROTOMY (N/A) SALPINGO OOPHORECTOMY (Bilateral) HERNIA REPAIR VENTRAL ADULT (N/A)  Subjective: Patient reports tolerating regular diet with no nausea or emesis reported.  Moderate incisional pain reported at this moment.  States she has only had tylenol and would like something stronger.  Ambulating without difficulty with assist.  Due to void since foley removal. IS brought to the room and pt instructed on use.  SCDs hose off since last pm per pt. States she is burping but not passing flatus.  Denies chest pain, dyspnea.  No concerns voiced.    Objective: Vital signs in last 24 hours: Temp:  [97.5 F (36.4 C)-97.8 F (36.6 C)] 97.7 F (36.5 C) (11/13 0515) Pulse Rate:  [66-102] 72 (11/13 0515) Resp:  [12-20] 16 (11/13 0515) BP: (124-152)/(60-89) 124/60 (11/13 0515) SpO2:  [93 %-100 %] 97 % (11/13 0515) Weight:  [229 lb 15 oz (104.3 kg)-230 lb (104.3 kg)] 229 lb 15 oz (104.3 kg) (11/12 1230) Last BM Date: 07/25/18  Intake/Output from previous day: 11/12 0701 - 11/13 0700 In: 3304.9 [P.O.:840; I.V.:2364.9; IV Piggyback:100] Out: 7310 [Urine:1460; Blood:50]  Physical Examination: General: alert, cooperative and no distress Resp: clear to auscultation bilaterally Cardio: regular rate and rhythm, S1, S2 normal, no murmur, click, rub or gallop GI: incision: op site dressing to midline abdominal incision intact with no drainage present and abdomen soft, obese, binder in place, hypoactive bowel sounds Extremities: extremities normal, atraumatic, no cyanosis or edema  Labs: WBC/Hgb/Hct/Plts:  19.0/14.6/44.8/233 (11/13 0336) BUN/Cr/glu/ALT/AST/amyl/lip:  11/0.84/--/--/--/--/-- (11/13 0336)  Assessment: 57 y.o. s/p Procedure(s): EXPLORATORY LAPAROTOMY SALPINGO OOPHORECTOMY HERNIA REPAIR VENTRAL ADULT: stable Pain:  Pain is well-controlled on PRN medications.  Heme: Hgb 14.6 and Hct 44.8 this am.  Appropriate this am  post-operatively.  CV: BP and HR stable post-op. Lisinopril ordered. Continue to monitor with ordered vital signs.  GI:  Tolerating po: Yes. Antiemetics ordered PRN. Scopolamine patch in place.  GU: Due to void since foley removal.  100 cc of urine emptied last pm then next value at 5am (750 cc). Creatinine 0.84   FEN: No critical values. Na+ 134 this am.    Prophylaxis: SCDs and lovenox given pre-op and post-op  Plan: Diet as tolerated. Kpad to the room for symptom relief. Encourage IS use, deep breathing, and coughing. Continue plan of care per Dr. Gerarda Fraction. Plan for discharge when return of bowel function, voiding, pain controlled. Possible discharge tomorrow.   LOS: 1 day    Melanie Gallegos 07/26/2018, 8:27 AM

## 2018-07-27 ENCOUNTER — Telehealth: Payer: Self-pay

## 2018-07-27 MED ORDER — SENNOSIDES-DOCUSATE SODIUM 8.6-50 MG PO TABS: 2.0000 | ORAL_TABLET | Freq: Every day | ORAL | 1 refills | Status: AC

## 2018-07-27 NOTE — Discharge Instructions (Addendum)
07/25/2018  Return to work: 4 weeks  Activity: 1. Be up and out of the bed during the day.  Take a nap if needed.  You may walk up steps but be careful and use the hand rail.  Stair climbing will tire you more than you think, you may need to stop part way and rest.   2. No lifting or straining for 8 weeks.  3. No driving for 2 weeks.  Do Not drive if you are taking narcotic pain medicine.  4. Shower daily.  Use soap and water on your incision and pat dry; don't rub.   5. No sexual activity and nothing in the vagina for 2 weeks.  6. REMOVE THE DRESSING OVER YOUR INCISION 5 DAYS FROM SURGERY. YOU DO NOT NEED TO PLACE A NEW DRESSING. LEAVE OPEN TO AIR.  Medications:  - Take ibuprofen and tylenol first line for pain control. Take these regularly (every 6 hours) to decrease the build up of pain.  - If necessary, for severe pain not relieved by ibuprofen, take oxycodone.  - While taking oxycodone you should take sennakot every night to reduce the likelihood of constipation. If this causes diarrhea, stop its use.  Diet: 1. Low sodium Heart Healthy Diet is recommended.  2. It is safe to use a laxative if you have difficulty moving your bowels.   Wound Care: 1. Keep clean and dry.  Shower daily.  Reasons to call the Doctor:   Fever - Oral temperature greater than 100.4 degrees Fahrenheit  Foul-smelling vaginal discharge  Difficulty urinating  Nausea and vomiting  Increased pain at the site of the incision that is unrelieved with pain medicine.  Difficulty breathing with or without chest pain  New calf pain especially if only on one side  Sudden, continuing increased vaginal bleeding with or without clots.   Follow-up: 1. See Everitt Amber in 4 weeks.  Contacts: For questions or concerns you should contact:  Dr. Everitt Amber at [phone] After hours and on week-ends call [phone] and ask to speak to the physician on call for Gynecologic Oncology

## 2018-07-27 NOTE — Discharge Summary (Signed)
Physician Discharge Summary  Patient ID: Melanie Gallegos MRN: [id number] DOB/AGE: 10/30/1960 57 y.o.  Admit date: 07/25/2018 Discharge date: 07/27/2018  Admission Diagnoses: Ovarian mass  Discharge Diagnoses:  Principal Problem:   Ovarian mass Active Problems:   Mass, ovarian   Ventral hernia without obstruction or gangrene   Pelvic mass in female   Discharged Condition:  The patient is in good condition and stable for discharge.    Hospital Course: On 07/25/2018, the patient underwent the following: Procedure(s): [place] ADULT.  The postoperative course was uneventful.  She was discharged to home on postoperative day 2 tolerating a regular diet, ambulating, no pain reported, passing flatus. Pt states she has pain medication, oxycodone, if needed for pain.  Consults: None  Significant Diagnostic Studies: None  Treatments: surgery: see above  Discharge Exam: Blood pressure (!) 109/51, pulse (!) 55, temperature 97.8 F (36.6 C), temperature source Oral, resp. rate 20, height 5\' 4"  (1.626 m), weight 229 lb 15 oz (104.3 kg), SpO2 97 %. General appearance: alert, cooperative and no distress Resp: clear to auscultation bilaterally Cardio: regular rate and rhythm, S1, S2 normal, no murmur, click, rub or gallop GI: soft, non-tender; bowel sounds normal; no masses,  no organomegaly Extremities: extremities normal, atraumatic, no cyanosis or edema Incision/Wound: Midline incision with op site dressing in place with no drainage or erythema. Binder in place  Disposition: Discharge disposition: 01-Home or Self Care       Discharge Instructions    Call MD for:  difficulty breathing, headache or visual disturbances   Complete by:  As directed    Call MD for:  extreme fatigue   Complete by:  As directed    Call MD for:  hives   Complete by:  As directed    Call MD for:  persistant dizziness or light-headedness   Complete  by:  As directed    Call MD for:  persistant nausea and vomiting   Complete by:  As directed    Call MD for:  redness, tenderness, or signs of infection (pain, swelling, redness, odor or green/yellow discharge around incision site)   Complete by:  As directed    Call MD for:  severe uncontrolled pain   Complete by:  As directed    Call MD for:  temperature >100.4   Complete by:  As directed    Diet - low sodium heart healthy   Complete by:  As directed    Driving Restrictions   Complete by:  As directed    No driving for 2 weeks from surgery.  Do not take narcotics and drive.   Increase activity slowly   Complete by:  As directed    Lifting restrictions   Complete by:  As directed    No lifting greater than 10 lbs for 8 weeks   Sexual Activity Restrictions   Complete by:  As directed    No sexual activity, nothing in the vagina, for 2-4 weeks.     Allergies as of 07/27/2018   No Known Allergies     Medication List    TAKE these medications   acetaminophen 500 MG tablet Commonly known as:  TYLENOL Take 1,000 mg by mouth every 6 (six) hours as needed for moderate pain or headache.   albuterol 108 (90 Base) MCG/ACT inhaler Commonly known as:  PROVENTIL HFA;VENTOLIN HFA Inhale 1 puff into the lungs every 6 (six) hours as needed for wheezing or shortness of breath.  hydrochlorothiazide 25 MG tablet Commonly known as:  HYDRODIURIL Take 25 mg by mouth daily.   lisinopril 20 MG tablet Commonly known as:  PRINIVIL,ZESTRIL Take 20 mg by mouth daily.   metoprolol tartrate 50 MG tablet Commonly known as:  LOPRESSOR Take 50 mg by mouth daily.   senna-docusate 8.6-50 MG tablet Commonly known as:  Senokot-S Take 2 tablets by mouth at bedtime. Hold for loose stools      Follow-up Information    Everitt Amber, MD Follow up on 08/16/2018.   Specialty:  Gynecologic Oncology Why:  at 9:45am at the [facility] information: [place]  [id number] [phone]           Greater than thirty minutes were spend for face to face discharge instructions and discharge orders/summary in EPIC.   Signed: Dorothyann Gibbs 07/27/2018, 11:12 AM

## 2018-07-27 NOTE — Telephone Encounter (Signed)
Patient called back - results for surg path given per Joylene John NP as "no cancer".  Pt voiced understanding and no other needs per pt .  She reports she is doing well, resting.  Has f/u appt on Dec 4th.

## 2018-07-27 NOTE — Telephone Encounter (Signed)
Outgoing call per Joylene John NP regarding surgical path report as "no cancer"- pt's son answered phone and said mom is asleep- left message with him to have her call our office.  Voiced understanding.

## 2018-07-27 NOTE — Progress Notes (Signed)
Discharge and medication instructions reviewed with patient and spouse. Questions answered and both deny further questions. Nom prescriptions given to patient. Spouse is driving patient home. Donne Hazel, RN

## 2018-08-05 ENCOUNTER — Encounter (HOSPITAL_COMMUNITY): Payer: Self-pay | Admitting: Obstetrics and Gynecology

## 2018-08-05 ENCOUNTER — Emergency Department (HOSPITAL_COMMUNITY)
Admission: EM | Admit: 2018-08-05 | Discharge: 2018-08-05 | Disposition: A | Discharge status: Home / Self Care | Payer: Medicaid Other | Attending: Emergency Medicine | Admitting: Emergency Medicine

## 2018-08-05 ENCOUNTER — Other Ambulatory Visit: Payer: Self-pay

## 2018-08-05 DIAGNOSIS — Z79899 Other long term (current) drug therapy: Secondary | ICD-10-CM | POA: Insufficient documentation

## 2018-08-05 DIAGNOSIS — F419 Anxiety disorder, unspecified: Secondary | ICD-10-CM | POA: Insufficient documentation

## 2018-08-05 DIAGNOSIS — Y69 Unspecified misadventure during surgical and medical care: Secondary | ICD-10-CM | POA: Insufficient documentation

## 2018-08-05 DIAGNOSIS — F17210 Nicotine dependence, cigarettes, uncomplicated: Secondary | ICD-10-CM | POA: Insufficient documentation

## 2018-08-05 DIAGNOSIS — I10 Essential (primary) hypertension: Secondary | ICD-10-CM | POA: Insufficient documentation

## 2018-08-05 DIAGNOSIS — T8131XA Disruption of external operation (surgical) wound, not elsewhere classified, initial encounter: Secondary | ICD-10-CM | POA: Insufficient documentation

## 2018-08-05 NOTE — ED Provider Notes (Signed)
[facility] DEPT Provider Note   CSN: [id number] Arrival date & time: 08/05/18  1431     History   Chief Complaint Chief Complaint  Patient presents with  . Bleeding at incision    HPI Melanie Gallegos is a 57 y.o. female.  Who presents the emergency department for evaluation of abdominal surgical wound.  Patient is status post ex lap with salpingo-oophorectomy and mass removal with negative pathology for cancer.  She has a midline abdominal surgical incision through the umbilicus.  She noticed some bleeding from the wound today and came in for evaluation.  She denies any heat, redness, tenderness outside of what she already had present, fever, chills, redness or streaking.  HPI  Past Medical History:  Diagnosis Date  . Anxiety   . Bilateral ovarian cysts   . Depression   . Headache    sinus HA  . History of kidney stones   . Hypertension   . Panic attacks     Patient Active Problem List   Diagnosis Date Noted  . Mass, ovarian 07/25/2018  . Pelvic mass in female 07/25/2018  . Ventral hernia without obstruction or gangrene   . Ovarian mass 07/19/2018    Past Surgical History:  Procedure Laterality Date  . LAPAROTOMY N/A 07/25/2018   Procedure: EXPLORATORY LAPAROTOMY;  Surgeon: Everitt Amber, MD;  Location: [facility] ORS;  Service: Gynecology;  Laterality: N/A;  . SALPINGOOPHORECTOMY Bilateral 07/25/2018   Procedure: SALPINGO OOPHORECTOMY;  Surgeon: Everitt Amber, MD;  Location: [facility] ORS;  Service: Gynecology;  Laterality: Bilateral;  . TUBAL LIGATION     26years ago  . VENTRAL HERNIA REPAIR N/A 07/25/2018   Procedure: HERNIA REPAIR VENTRAL ADULT;  Surgeon: Everitt Amber, MD;  Location: [facility] ORS;  Service: Gynecology;  Laterality: N/A;     OB History   None      Home Medications    Prior to Admission medications   Medication Sig Start Date End Date Taking? Authorizing Provider  acetaminophen (TYLENOL) 500 MG tablet Take 1,000 mg by mouth  every 6 (six) hours as needed for moderate pain or headache.    [provider]  albuterol (PROVENTIL HFA;VENTOLIN HFA) 108 (90 Base) MCG/ACT inhaler Inhale 1 puff into the lungs every 6 (six) hours as needed for wheezing or shortness of breath.     [provider]  hydrochlorothiazide (HYDRODIURIL) 25 MG tablet Take 25 mg by mouth daily.    [provider]  lisinopril (PRINIVIL,ZESTRIL) 20 MG tablet Take 20 mg by mouth daily. 06/27/18   [provider]  metoprolol tartrate (LOPRESSOR) 50 MG tablet Take 50 mg by mouth daily.  07/13/18   [provider]  senna-docusate (SENOKOT-S) 8.6-50 MG tablet Take 2 tablets by mouth at bedtime. Hold for loose stools 07/27/18   Joylene John D, NP    Family History Family History  Problem Relation Age of Onset  . Bladder Cancer Mother   . Heart disease Mother   . Thyroid disease Mother   . Atrial fibrillation Mother   . Arthritis Mother   . Parkinson's disease Father   . Thyroid disease Sister   . Endometriosis Sister     Social History Social History   Tobacco Use  . Smoking status: Current Every Day Smoker  . Smokeless tobacco: Never Used  . Tobacco comment: 1 to 4 cigarrettes a day  Substance Use Topics  . Alcohol use: Never    Frequency: Never  . Drug use: Never  Allergies   Patient has no known allergies.   Review of Systems Review of Systems  Ten systems reviewed and are negative for acute change, except as noted in the HPI.   Physical Exam Updated Vital Signs BP (!) 156/69 (BP Location: Left Arm)   Pulse 68   Temp 97.9 F (36.6 C) (Oral)   Resp 16   Ht 5\' 4"  (1.626 m)   Wt 105.2 kg   SpO2 99%   BMI 39.82 kg/m   Physical Exam  Constitutional: She is oriented to person, place, and time. She appears well-developed and well-nourished. No distress.  HENT:  Head: Normocephalic and atraumatic.  Eyes: Conjunctivae are normal. No scleral icterus.  Neck: Normal range of  motion.  Cardiovascular: Normal rate, regular rhythm and normal heart sounds. Exam reveals no gallop and no friction rub.  No murmur heard. Pulmonary/Chest: Effort normal and breath sounds normal. No respiratory distress.  Abdominal: Soft. Bowel sounds are normal. She exhibits no distension and no mass. There is no tenderness. There is no guarding.  Midline well-healing surgical incision site.  On the right side of the umbilicus there is a tiny, half centimeter slit of wound dehiscence with rust colored serous sanguinous fluid which intermittently leaks slowly from the wound with movement of the abdomen.  No heat, redness tenderness or streaking  Neurological: She is alert and oriented to person, place, and time.  Skin: Skin is warm and dry. She is not diaphoretic.  Psychiatric: Her behavior is normal.  Nursing note and vitals reviewed.    ED Treatments / Results  Labs (all labs ordered are listed, but only abnormal results are displayed) Labs Reviewed - No data to display  EKG None  Radiology No results found.  Procedures Procedures (including critical care time)  Medications Ordered in ED Medications - No data to display   Initial Impression / Assessment and Plan / ED Course  I have reviewed the triage vital signs and the nursing notes.  Pertinent labs & imaging results that were available during my care of the patient were reviewed by me and considered in my medical decision making (see chart for details).     Patient with a very minor amount of wound dehiscence on the right side of her umbilicus.  She has no active bleeding, no signs of infection.  I cleaned and redressed the wound.  The patient is advised to continue with home care, change her dressings as needed and follow-up with her surgeon on Monday.  Discussed return precautions.  Appears appropriate for discharge at this time  Final Clinical Impressions(s) / ED Diagnoses   Final diagnoses:  Dehiscence of  operative wound, initial encounter    ED Discharge Orders    None       Margarita Mail, PA-C 08/05/18 [place], DO 08/05/18 2030

## 2018-08-05 NOTE — Discharge Instructions (Signed)
You have a very tiny opening in your abdominal surgical incision.  There appears to be serosanguineous drainage.  It is okay at this point and I do not see any signs of infection.  Continue to clean the wound as you have been previously.  If your wound becomes hot, red, extremely tender or you have surrounding redness and tenderness with streaking, or fevers you need to be seen immediately by either your surgeon or in the emergency department. Continue to be gentle with your wound and do not do anything that causes abdominal strain.  Please call your surgeon and be seen Monday so they can evaluate the area that has opened.

## 2018-08-05 NOTE — ED Triage Notes (Signed)
Pt reports she has had some bleeding from her abdominal incision that started x3 days ago. Pt reports no puss at the site, just small amounts of blood.  Pt is alert and ambulatory in triage.

## 2018-08-07 ENCOUNTER — Encounter: Payer: Self-pay | Admitting: Gynecologic Oncology

## 2018-08-07 ENCOUNTER — Inpatient Hospital Stay (HOSPITAL_BASED_OUTPATIENT_CLINIC_OR_DEPARTMENT_OTHER): Payer: Self-pay | Admitting: Gynecologic Oncology

## 2018-08-07 VITALS — BP 135/69 | HR 54 | Temp 97.8°F | Resp 16 | Ht 64.0 in | Wt 220.0 lb

## 2018-08-07 DIAGNOSIS — Z90722 Acquired absence of ovaries, bilateral: Secondary | ICD-10-CM

## 2018-08-07 DIAGNOSIS — R239 Unspecified skin changes: Secondary | ICD-10-CM

## 2018-08-07 NOTE — Patient Instructions (Signed)
Change the dressing over the incision and in your belly button once a day. Please call the office for any concerns.  Follow up as scheduled or sooner if needed.

## 2018-08-09 ENCOUNTER — Encounter: Payer: Self-pay | Admitting: Gynecologic Oncology

## 2018-08-09 NOTE — Progress Notes (Signed)
Patient seen for evaluation of abdominal incision drainage after being seen in the ED the day before.  She went to the ED on 08/05/18. She noted mild bleeding from the wound.  She is s/p exploratory laparotomy with bilateral salpingo-oophorectomy, ventral hernia repair on 07/25/18 with Dr. Everitt Amber.  She states she has been doing well post-operatively. Bowels and bladder functioning without difficulty. Minimal pain reported. States the incision started draining a rust colored discharge.  Also noting an open around at the umbilicus with the incision. No other concerns voiced. No fever or chills reported. Tolerating diet.    General: Well developed, well nourished female in no acute distress. Alert and oriented x 3.  Cardiovascular: Regular rate and rhythm. S1 and S2 normal.  Lungs: Clear to auscultation bilaterally. No wheezes/crackles/rhonchi noted.  Abdomen: Abdomen soft, non-tender and obese. Active bowel sounds in all quadrants. See media for photo. Superficial opening of the incision at the umbilicus. No evidence of infection. Resolving minimal ecchymosis noted around the incision.  Minimal rust colored clear drainage with light palpation of the incision. No evidence of infection.   Extremities: No bilateral cyanosis, edema, or clubbing.   P: Patient advised to keep a dry dressing over the incision and change daily.  Advised the drainage may continue or may stop but to call if it increases or changes to a pus-like consistency.  Advised that it is a positive thing to have the fluid draining out instead of staying underneath the skin.  She is to follow up as scheduled or sooner if needed.  Discussed placing a dampened 2x2 sterile gauze in the umbilicus to the minor opening in the incision. Reportable signs and symptoms reviewed. Dr. Denman George aware of situation. All questions answered.

## 2018-08-16 ENCOUNTER — Inpatient Hospital Stay: Payer: Self-pay | Attending: Gynecologic Oncology | Admitting: Gynecologic Oncology

## 2018-08-16 ENCOUNTER — Encounter: Payer: Self-pay | Admitting: Gynecologic Oncology

## 2018-08-16 VITALS — BP 119/47 | HR 50 | Temp 97.5°F | Resp 16 | Ht 64.0 in | Wt 224.0 lb

## 2018-08-16 DIAGNOSIS — F17210 Nicotine dependence, cigarettes, uncomplicated: Secondary | ICD-10-CM

## 2018-08-16 DIAGNOSIS — D279 Benign neoplasm of unspecified ovary: Secondary | ICD-10-CM

## 2018-08-16 DIAGNOSIS — F17200 Nicotine dependence, unspecified, uncomplicated: Secondary | ICD-10-CM | POA: Insufficient documentation

## 2018-08-16 DIAGNOSIS — Z90722 Acquired absence of ovaries, bilateral: Secondary | ICD-10-CM | POA: Insufficient documentation

## 2018-08-16 DIAGNOSIS — D271 Benign neoplasm of left ovary: Secondary | ICD-10-CM | POA: Insufficient documentation

## 2018-08-16 DIAGNOSIS — D270 Benign neoplasm of right ovary: Secondary | ICD-10-CM | POA: Insufficient documentation

## 2018-08-16 NOTE — Patient Instructions (Signed)
Dr Denman George is recommending you follow-up with Cyndi Bender annually for wellness checks. You still have a cervix and uterus, and any bleeding symptoms should be reported to him as this is not normal. You will continue to need pap smears every 5 years until age 55 as part of guidelines.  Dr Serita Grit office can be reached at [phone].  You can continue to place guaze in the belly button until it seals over. You can wipe neosporin on the are to help it to heal.

## 2018-08-16 NOTE — Progress Notes (Signed)
Follow-up (postop): Gyn-Onc  Consult was requested by the ED for the evaluation of Melanie Gallegos 57 y.o. female  CC:  Chief Complaint  Patient presents with  . Serous cystadenofibroma of the ovaries    follow up post-op    Assessment/Plan:  Melanie Gallegos  is a 57 y.o.  year old wio is s/p minilap and removal of a 23cm left ovarian mass, and smaller 7cm right ovarian cyst. Both benign cystadenofibromas. She also had repair of the umbilical hernia at the same surgery (on 07/25/18). She has a small degree of wound separation (<0GY) at the umbilicus without associated cellulitis. I recommend neosporin.  Counseled that she still needs paps as she has a cervix, and should report any bleeding symptoms to her PCP.  No specific follow-up with me necessary at this point.   HPI: Melanie Gallegos is seen today in referral from the emergency department for consultation regarding a bilateral ovarian cystic mass is seen on CT scan for renal stones.  The patient is 57 years of age and parous.  She was experiencing left flank pain on July 11, 2018 was seen by the emergency room at [facility].  CT scan of the abdomen and pelvis without contrast was performed which confirmed left-sided renal calculi, and an incidentally found 27.4 x 16 x 28 cm septated cystic mass occupying the entire pelvis and extending into the upper abdomen.  A papillary excrescence was noted off the posterior aspect of the mass which measured up to 4 cm but otherwise no other significant soft tissue densities was seen.  A separate cystic mass within the left ovary was also identified without septations and measured 7.4 cm.  A small fat-containing supraumbilical hernia was identified measuring 7.6 cm.  Tumor markers were not drawn  The patient is otherwise fairly healthy.  She has osteoarthritis of the spine and knees and depression anxiety and hypertension.  She has never had an abnormal Pap smear.  She has had 2 prior  vaginal deliveries.  Her any prior abdominal surgery is a tubal ligation.  She reports gradual onset of abdominal distention over the past 2 to 4 years.  She occasionally feels a burning sensation at the umbilicus but no other symptoms from this mass.  She reports some urinary frequency, but denies early satiety or difficulty eating.  Interval Hx:  On 07/25/18 she underwent minilap, BSO, repair of umbilical hernia. Final pathology revealed bilateral benign cystadenofibromas. Surgery and recovery was uncomplicated. She developed an area of wound separation at the umbilicus with drainage in the postop period.   Current Meds:  Outpatient Encounter Medications as of 08/16/2018  Medication Sig  . acetaminophen (TYLENOL) 500 MG tablet Take 1,000 mg by mouth every 6 (six) hours as needed for moderate pain or headache.  . albuterol (PROVENTIL HFA;VENTOLIN HFA) 108 (90 Base) MCG/ACT inhaler Inhale 1 puff into the lungs every 6 (six) hours as needed for wheezing or shortness of breath.   . hydrochlorothiazide (HYDRODIURIL) 25 MG tablet Take 25 mg by mouth daily.  Marland Kitchen lisinopril (PRINIVIL,ZESTRIL) 20 MG tablet Take 20 mg by mouth daily.  . metoprolol tartrate (LOPRESSOR) 50 MG tablet Take 50 mg by mouth daily.   Marland Kitchen senna-docusate (SENOKOT-S) 8.6-50 MG tablet Take 2 tablets by mouth at bedtime. Hold for loose stools   No facility-administered encounter medications on file as of 08/16/2018.     Allergy: No Known Allergies  Social Hx:   Social History   Socioeconomic History  .  Marital status: Married    Spouse name: Not on file  . Number of children: Not on file  . Years of education: Not on file  . Highest education level: Not on file  Occupational History  . Not on file  Social Needs  . Financial resource strain: Not on file  . Food insecurity:    Worry: Not on file    Inability: Not on file  . Transportation needs:    Medical: Not on file    Non-medical: Not on file  Tobacco Use  .  Smoking status: Current Every Day Smoker  . Smokeless tobacco: Never Used  . Tobacco comment: 1 to 4 cigarrettes a day  Substance and Sexual Activity  . Alcohol use: Never    Frequency: Never  . Drug use: Never  . Sexual activity: Not on file  Lifestyle  . Physical activity:    Days per week: Not on file    Minutes per session: Not on file  . Stress: Not on file  Relationships  . Social connections:    Talks on phone: Not on file    Gets together: Not on file    Attends religious service: Not on file    Active member of club or organization: Not on file    Attends meetings of clubs or organizations: Not on file    Relationship status: Not on file  . Intimate partner violence:    Fear of current or ex partner: Not on file    Emotionally abused: Not on file    Physically abused: Not on file    Forced sexual activity: Not on file  Other Topics Concern  . Not on file  Social History Narrative  . Not on file    Past Surgical Hx:  Past Surgical History:  Procedure Laterality Date  . LAPAROTOMY N/A 07/25/2018   Procedure: EXPLORATORY LAPAROTOMY;  Surgeon: Everitt Amber, MD;  Location: [facility];  Service: Gynecology;  Laterality: N/A;  . SALPINGOOPHORECTOMY Bilateral 07/25/2018   Procedure: SALPINGO OOPHORECTOMY;  Surgeon: Everitt Amber, MD;  Location: [facility];  Service: Gynecology;  Laterality: Bilateral;  . TUBAL LIGATION     26years ago  . VENTRAL HERNIA REPAIR N/A 07/25/2018   Procedure: HERNIA REPAIR VENTRAL ADULT;  Surgeon: Everitt Amber, MD;  Location: [facility];  Service: Gynecology;  Laterality: N/A;    Past Medical Hx:  Past Medical History:  Diagnosis Date  . Anxiety   . Bilateral ovarian cysts   . Depression   . Headache    sinus HA  . History of kidney stones   . Hypertension   . Panic attacks     Past Gynecological History:  G2P2 No LMP recorded. Patient is postmenopausal. No history of abnormal paps.  Family Hx:  Family History  Problem Relation Age of Onset   . Bladder Cancer Mother   . Heart disease Mother   . Thyroid disease Mother   . Atrial fibrillation Mother   . Arthritis Mother   . Parkinson's disease Father   . Thyroid disease Sister   . Endometriosis Sister     Review of Systems:  Constitutional  Feels well,    ENT Normal appearing ears and nares bilaterally Skin/Breast  No rash, sores, jaundice, itching, dryness Cardiovascular  No chest pain, shortness of breath, or edema  Pulmonary  No cough or wheeze.  Gastro Intestinal  No nausea, vomitting, or diarrhoea. No bright red blood per rectum, no abdominal pain, change in bowel movement, or  constipation. Less distension Genito Urinary  No frequency, urgency, dysuria,  Musculo Skeletal  No myalgia, arthralgia, joint swelling or pain  Neurologic  No weakness, numbness, change in gait,  Psychology  No depression, anxiety, insomnia.   Vitals:  Blood pressure (!) 119/47, pulse (!) 50, temperature (!) 97.5 F (36.4 C), temperature source Oral, resp. rate 16, height 5\' 4"  (1.626 m), weight 224 lb (101.6 kg), SpO2 99 %.  Physical Exam: WD in NAD Neck  Supple NROM, without any enlargements.  Lymph Node Survey No cervical supraclavicular or inguinal adenopathy Cardiovascular  Pulse normal rate, regularity and rhythm. S1 and S2 normal.  Lungs  Clear to auscultation bilateraly, without wheezes/crackles/rhonchi. Good air movement.  Skin  No rash/lesions/breakdown  Psychiatry  Alert and oriented to person, place, and time  Abdomen  Normoactive bowel sounds, abdomen soft, non-tender and mildly obese without palpable evidence of hernia. There is a cystic mobile fullness appreciated throughout the entire abdomen. Minilap is healed well with exception of 63mm area in umbilicus - separated edges, no drainage or cellulitis.  Back No CVA tenderness Genito Urinary  deferred Rectal  Good tone, no masses no cul de sac nodularity.  Extremities  No bilateral cyanosis, clubbing or  edema.   Thereasa Solo, MD  08/16/2018, 10:43 AM

## 2020-01-07 ENCOUNTER — Other Ambulatory Visit: Payer: Self-pay

## 2020-01-07 DIAGNOSIS — R928 Other abnormal and inconclusive findings on diagnostic imaging of breast: Secondary | ICD-10-CM

## 2020-01-10 ENCOUNTER — Other Ambulatory Visit: Payer: Self-pay

## 2020-01-10 NOTE — Progress Notes (Signed)
Created in error

## 2020-01-29 ENCOUNTER — Ambulatory Visit: Payer: Self-pay | Admitting: *Deleted

## 2020-01-29 ENCOUNTER — Other Ambulatory Visit: Payer: Self-pay

## 2020-01-29 ENCOUNTER — Other Ambulatory Visit: Payer: Self-pay | Admitting: Obstetrics and Gynecology

## 2020-01-29 ENCOUNTER — Ambulatory Visit
Admission: RE | Admit: 2020-01-29 | Discharge: 2020-01-29 | Disposition: A | Discharge status: Home / Self Care | Payer: Medicaid Other | Source: Ambulatory Visit | Attending: Obstetrics and Gynecology | Admitting: Obstetrics and Gynecology

## 2020-01-29 ENCOUNTER — Ambulatory Visit: Payer: Medicaid Other

## 2020-01-29 VITALS — BP 132/78 | Wt 279.7 lb

## 2020-01-29 DIAGNOSIS — Z1231 Encounter for screening mammogram for malignant neoplasm of breast: Secondary | ICD-10-CM

## 2020-01-29 DIAGNOSIS — R928 Other abnormal and inconclusive findings on diagnostic imaging of breast: Secondary | ICD-10-CM

## 2020-01-29 DIAGNOSIS — Z1239 Encounter for other screening for malignant neoplasm of breast: Secondary | ICD-10-CM

## 2020-01-29 NOTE — Progress Notes (Signed)
[name] is a 59 y.o. female who presents to [facility] clinic today with no complaints.  Last screening mammogram was completed at [facility] 07/26/2014 and additional imaging of the right breast was recommended. Right breast ultrasound was completed 08/23/2014 that was benign recommending a screening mammogram in one year.   Pap Smear: Pap not smear completed today. Last Pap smear unknown. Per patient was 07/25/2018 at the [facility] clinic and was normal per patient. Cytology completed on 07/25/2018 was for a cyst and peritoneal washing. Per patient has no history of an abnormal Pap smear. No Pap smear results are in Epic.   Physical exam: Breasts Right breast slightly larger than left breast. No skin abnormalities bilateral breasts. No nipple retraction bilateral breasts. No nipple discharge bilateral breasts. No lymphadenopathy. No lumps palpated bilateral breasts. No complaints of pain or tenderness on exam.      Pelvic/Bimanual Will call patient to clarify last Pap smear and schedule with [facility] if last Pap smear greater than three years.   Smoking History: Patient is a former smoker that quit in 2019.   Patient Navigation: Patient education provided. Access to services provided for patient through [facility] program.   Colorectal Cancer Screening: Per patient has never had a colonoscopy completed. No complaints today.    Breast and Cervical Cancer Risk Assessment: Patient has no family history of breast cancer, known genetic mutations, or radiation treatment to the chest before age 42. Patient has no history of cervical dysplasia, immunocompromised, or DES exposure in-utero.  Risk Assessment    Risk Scores      01/29/2020   Last edited by: Melanie Parish, RN   5-year risk: 1.1 %   Lifetime risk: 7.1 %          A: [facility] exam without pap smear  P: Referred patient to the [facility] for a screening mammogram. Appointment  scheduled Tuesday, Jan 29, 2020 at 1400.  Melanie Parish, RN 01/29/2020 10:19 PM

## 2020-01-29 NOTE — Patient Instructions (Addendum)
Explained breast self awareness with [name]. Let patient know [facility] will cover Pap smears every 3 years unless has a history of abnormal Pap smears. Referred patient to the [facility] for a screening mammogram. Appointment scheduled Tuesday, Jan 29, 2020 at 1400. Patient aware of appointment and will be there. Let patient know the [facility] will follow up with her within the next couple weeks with results of her mammogram by letter or phone. Will call patient to clarify last Pap smear and schedule with [facility] if last Pap smear greater than three years. Melanie Gallegos verbalized understanding.  [name], [name], RN 10:20 PM

## 2020-02-25 ENCOUNTER — Telehealth: Payer: Self-pay

## 2020-02-25 NOTE — Telephone Encounter (Signed)
Left message on identifying voicemail, requesting patient to retrun call to office.

## 2020-02-27 ENCOUNTER — Telehealth: Payer: Self-pay

## 2020-02-27 NOTE — Telephone Encounter (Signed)
Patient left message on voicemail returning. I called and spoke with patient, per Etheleen Sia, RN, to verify date of last pap smear. Patient states she had a partial hysterectomy and per her pcp/gynecologist, next pap smear due in 3 years. See PCP recommendations-08/26/2018 office visit notes. Patient informed MD recommendations will be followed. Patient verbalized understanding.

## 2020-03-24 ENCOUNTER — Telehealth: Payer: Self-pay | Admitting: *Deleted

## 2020-03-24 NOTE — Telephone Encounter (Signed)
Patient received a bill for her screening mammogram and mailed a copy to me. Called patient and let her know the bill is covered by [facility]. Told patient that will send it to the [facility] to have removed from her account and billed to [facility]. Patient verbalized understanding.

## 2020-05-31 IMAGING — CT CT RENAL STONE PROTOCOL
2 of 4 series · 15 of 46 positions shown, 17 images · non-contrast
Comparison: CT 12/29/2006

CLINICAL DATA: Left flank pain starting at 6766 hours last evening.
Epigastric pain as well.

EXAM:
CT ABDOMEN AND PELVIS WITHOUT CONTRAST
TECHNIQUE: Multidetector CT imaging of the abdomen and pelvis was performed
following the standard protocol without IV contrast.

[Series 3: renal stone 5.0 · axial · 0.98mm/px · z∈[-565,-55]mm · 12 of 112 slices shown, 14 images]
[im 5/112  soft-tissue]
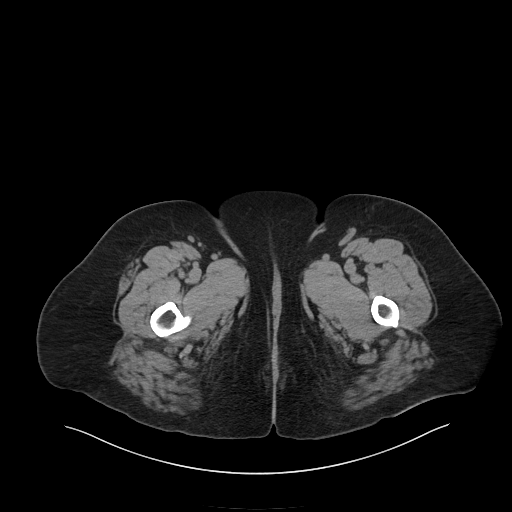
[im 5/112  bone]
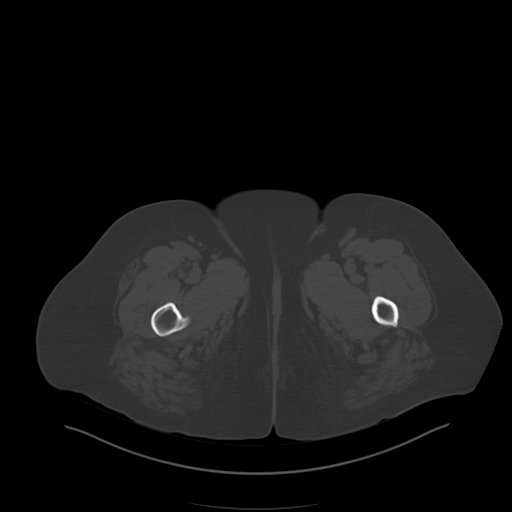
[im 15/112  soft-tissue]
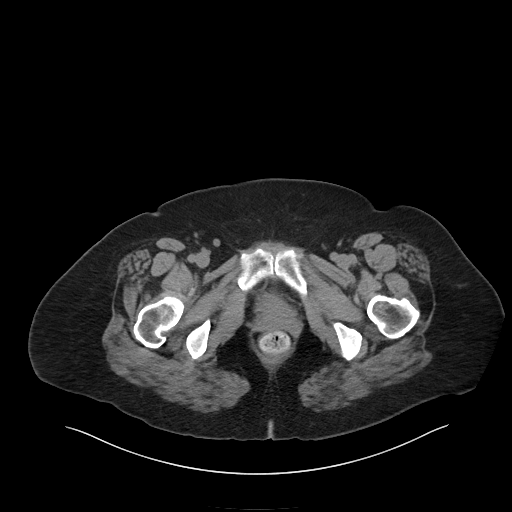
[im 25/112  soft-tissue]
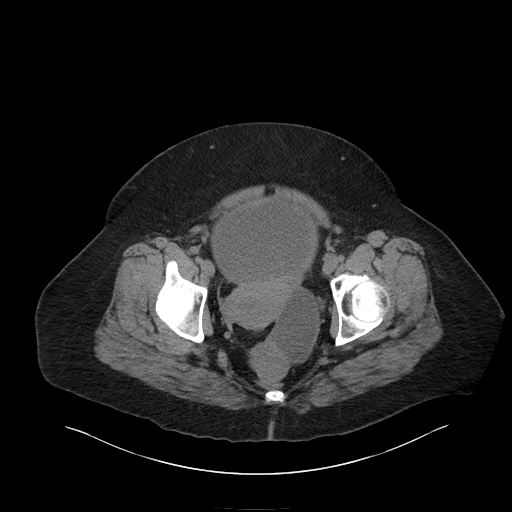
[im 34/112  soft-tissue]
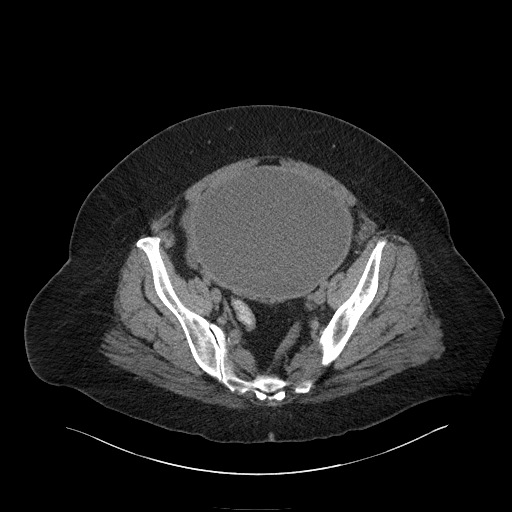
[im 44/112  soft-tissue]
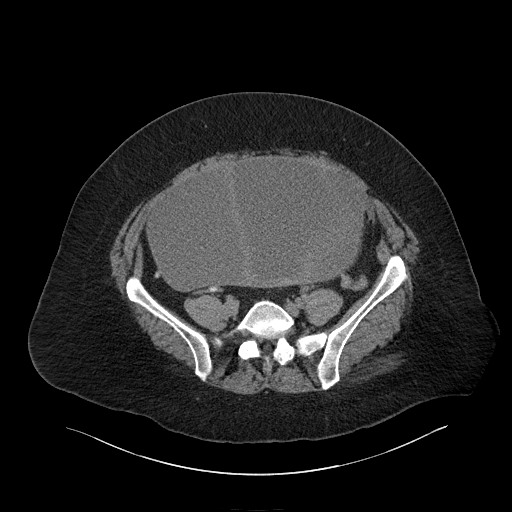
[im 54/112  soft-tissue]
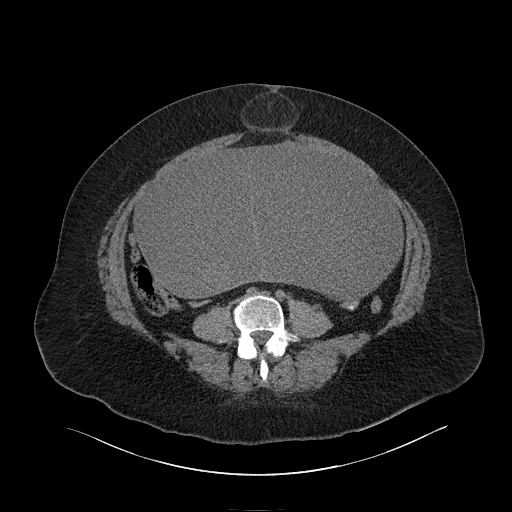
[im 58/112  soft-tissue]
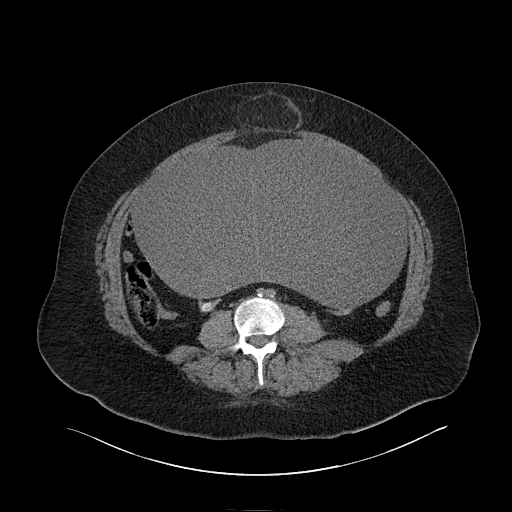
[im 68/112  soft-tissue]
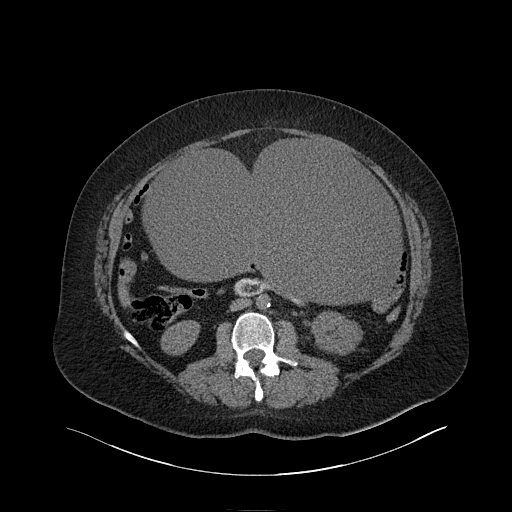
[im 78/112  soft-tissue]
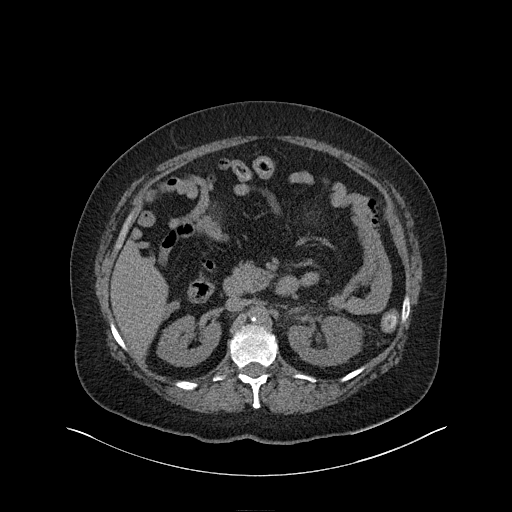
[im 78/112  bone]
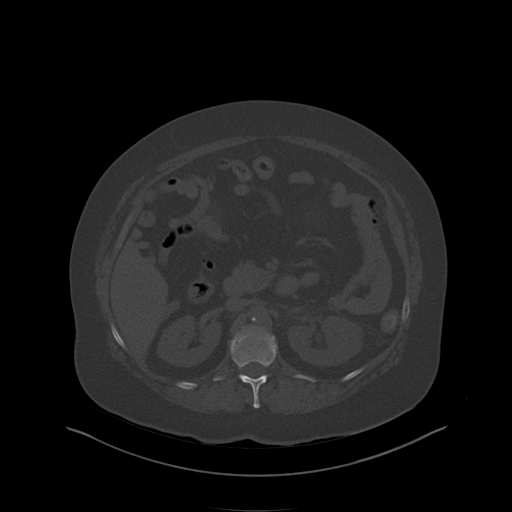
[im 87/112  soft-tissue]
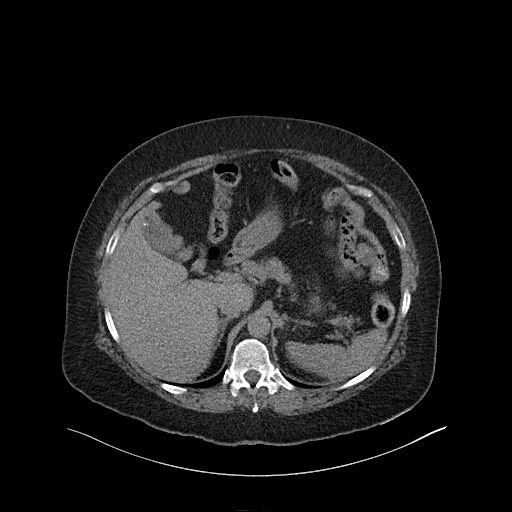
[im 97/112  soft-tissue]
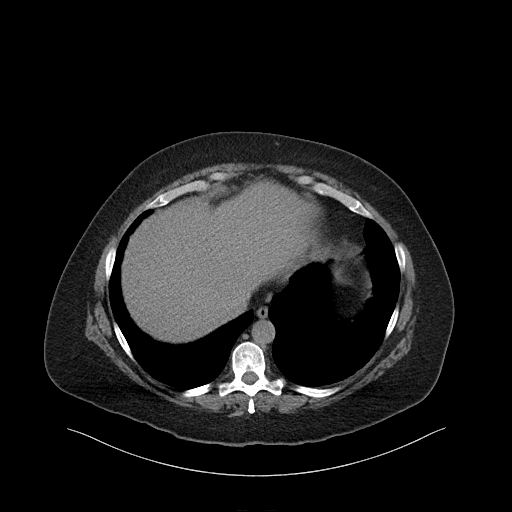
[im 107/112  soft-tissue]
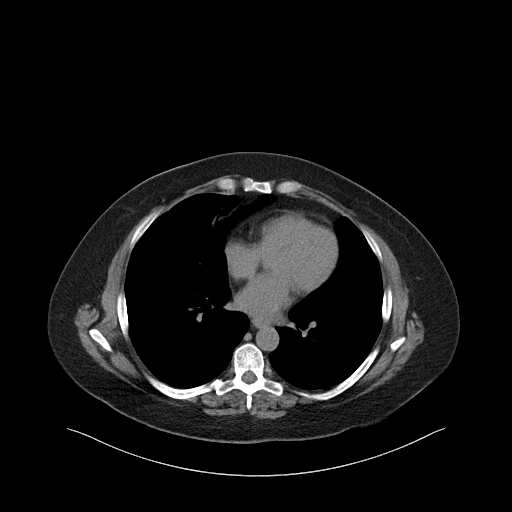

[Series 6: renal stone 3.0 cor · coronal · 0.89mm/px · 3 of 121 slices shown]
[im 41/121  soft-tissue]
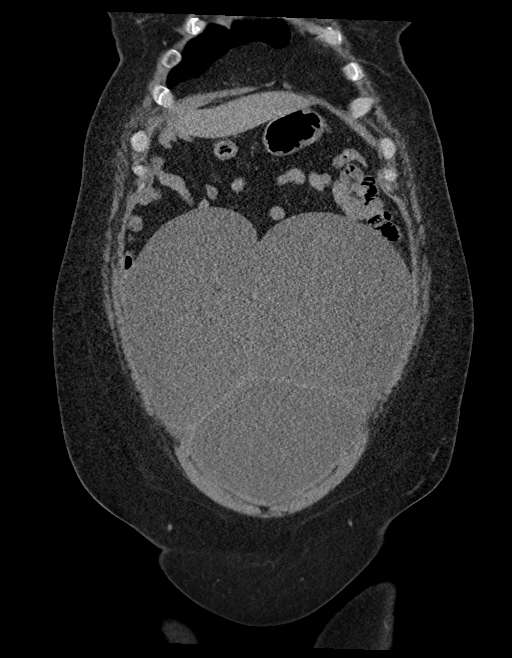
[im 54/121  soft-tissue]
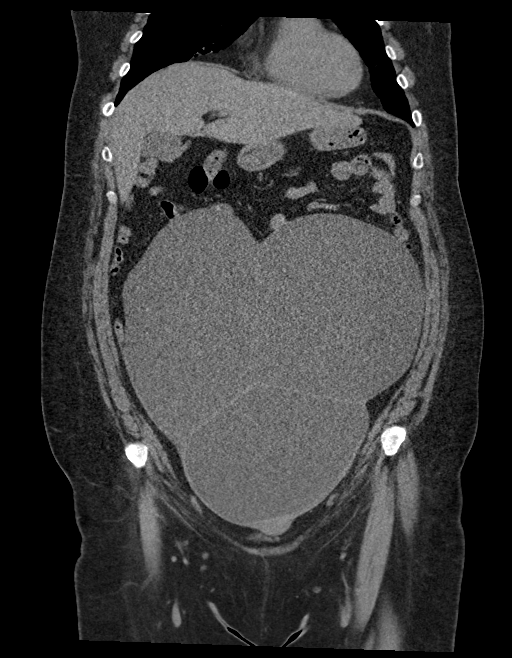
[im 67/121  soft-tissue]
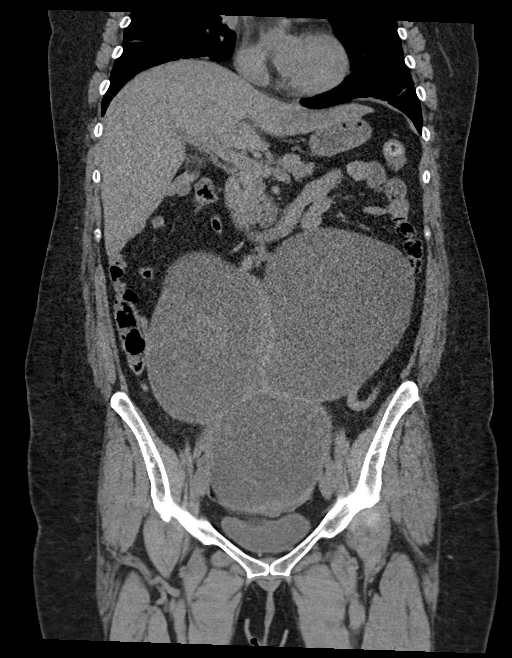

[15 of 46 positions shown; findings below may reference images not displayed]

FINDINGS: Lower chest: Normal heart size without pericardial effusion. Clear
lung bases without effusion mass. Subsegmental atelectasis and/or
scarring is seen at each base.

Hepatobiliary: Punctate calculus or calcified polyp near the fundus
of the gallbladder, series [DATE]. No secondary signs of acute
cholecystitis. The unenhanced liver is unremarkable.

Pancreas: Normal

Spleen: Normal

Adrenals/Urinary Tract: Normal bilateral adrenal glands. Mild
left-sided hydroureteronephrosis secondary to a 6 mm calculus in the
distal left ureter, series [DATE]. Right kidney and ureter
unremarkable. The urinary bladder is decompressed.

Stomach/Bowel: Stomach is within normal limits. Appendix appears
normal. No evidence of bowel wall thickening, distention, or
inflammatory changes.

Vascular/Lymphatic: Aortic atherosclerosis. No enlarged abdominal or
pelvic lymph nodes.

Reproductive: Large thinly septated cystic mass occupying much of
the pelvis and extending up into the upper abdomen measuring 27.4 x
15.9 x 28 cm. A papillary excrescence is noted off the posterior
aspect of mass along its upper septated compartment measuring
approximately 2.9 x 3.5 x 4.4 cm. Soft tissue density along the
caudal aspect of this mass may represent ovarian tissue versus a
nodular component. This measures 3.4 x 2.6 x 2 cm. A cystic mass of
the left ovary is also identified without septation or focal soft
tissue component measuring 7.4 x 4.6 x 3 9 cm. No evidence of
peritoneal implants or ascites.

Other: Fat containing supraumbilical hernia measuring 7.6 x 3.5 x 6
cm with left fat containing inguinal hernia are noted.

Musculoskeletal: No aggressive osseous lesions.
IMPRESSION: 1. Large thinly septated cystic mass measuring 27.4 x 15.9 x 28 cm
with papillary excrescence noted posteriorly within the upper most
right-sided septated compartment. Findings raise concern for a
cystic ovarian neoplasm. Additional left-sided ovarian cystic mass
without complicating features are also noted which may represent
bilateral disease. No definite evidence of peritoneal implantation
nor malignant ascites.
2. 6 mm distal left ureteral calculus with mild left-sided
hydronephrosis.
3. Uncomplicated cholelithiasis or calcified polyp near the fundus
of the gallbladder. No secondary signs of acute cholecystitis.
4. Fat containing supraumbilical hernia measuring 7.6 x 3.5 x 6 cm
with fat containing left inguinal hernia.

These results were called by telephone at the time of interpretation
on 07/11/2018 at [DATE] to PA JYOTIKA SOBCZAK , who verbally
acknowledged these results.

## 2024-08-07 ENCOUNTER — Other Ambulatory Visit: Payer: Self-pay | Admitting: Physician Assistant

## 2024-08-07 DIAGNOSIS — Z1231 Encounter for screening mammogram for malignant neoplasm of breast: Secondary | ICD-10-CM
